# Patient Record
Sex: Female | Born: 1940 | Race: White | Hispanic: No | Marital: Married | Smoking: Never smoker
Health system: Southern US, Community
[De-identification: ages and names within clinical notes are randomized; demographics above are authoritative.]

## PROBLEM LIST (undated history)

## (undated) DIAGNOSIS — E785 Hyperlipidemia, unspecified: Secondary | ICD-10-CM

## (undated) DIAGNOSIS — R413 Other amnesia: Secondary | ICD-10-CM

## (undated) DIAGNOSIS — N189 Chronic kidney disease, unspecified: Secondary | ICD-10-CM

## (undated) DIAGNOSIS — F0390 Unspecified dementia without behavioral disturbance: Secondary | ICD-10-CM

## (undated) HISTORY — DX: Other amnesia: R41.3

## (undated) HISTORY — DX: Hyperlipidemia, unspecified: E78.5

---

## 1997-05-27 ENCOUNTER — Ambulatory Visit (HOSPITAL_COMMUNITY): Admission: RE | Admit: 1997-05-27 | Discharge: 1997-05-27 | Payer: Self-pay | Admitting: Gynecology

## 1998-06-29 ENCOUNTER — Ambulatory Visit (HOSPITAL_COMMUNITY): Admission: RE | Admit: 1998-06-29 | Discharge: 1998-06-29 | Payer: Self-pay | Admitting: Gynecology

## 1998-09-15 ENCOUNTER — Other Ambulatory Visit: Admission: RE | Admit: 1998-09-15 | Discharge: 1998-09-15 | Payer: Self-pay | Admitting: Gynecology

## 1999-07-04 ENCOUNTER — Ambulatory Visit (HOSPITAL_COMMUNITY): Admission: RE | Admit: 1999-07-04 | Discharge: 1999-07-04 | Payer: Self-pay | Admitting: Gynecology

## 1999-07-04 ENCOUNTER — Encounter: Payer: Self-pay | Admitting: Gynecology

## 1999-10-26 ENCOUNTER — Other Ambulatory Visit: Admission: RE | Admit: 1999-10-26 | Discharge: 1999-10-26 | Payer: Self-pay | Admitting: Gynecology

## 2000-05-14 ENCOUNTER — Encounter: Payer: Self-pay | Admitting: Orthopedic Surgery

## 2000-05-14 ENCOUNTER — Encounter: Admission: RE | Admit: 2000-05-14 | Discharge: 2000-05-14 | Payer: Self-pay | Admitting: Orthopedic Surgery

## 2000-07-04 ENCOUNTER — Ambulatory Visit (HOSPITAL_COMMUNITY): Admission: RE | Admit: 2000-07-04 | Discharge: 2000-07-04 | Payer: Self-pay | Admitting: Gynecology

## 2000-07-04 ENCOUNTER — Encounter: Payer: Self-pay | Admitting: Gynecology

## 2000-11-23 ENCOUNTER — Encounter: Admission: RE | Admit: 2000-11-23 | Discharge: 2000-11-23 | Payer: Self-pay

## 2000-12-04 ENCOUNTER — Other Ambulatory Visit: Admission: RE | Admit: 2000-12-04 | Discharge: 2000-12-04 | Payer: Self-pay | Admitting: Gynecology

## 2001-07-10 ENCOUNTER — Ambulatory Visit (HOSPITAL_COMMUNITY): Admission: RE | Admit: 2001-07-10 | Discharge: 2001-07-10 | Payer: Self-pay | Admitting: Gynecology

## 2001-07-10 ENCOUNTER — Encounter: Payer: Self-pay | Admitting: Gynecology

## 2001-12-12 ENCOUNTER — Other Ambulatory Visit: Admission: RE | Admit: 2001-12-12 | Discharge: 2001-12-12 | Payer: Self-pay | Admitting: Gynecology

## 2002-07-18 ENCOUNTER — Encounter: Payer: Self-pay | Admitting: Gynecology

## 2002-07-18 ENCOUNTER — Ambulatory Visit (HOSPITAL_COMMUNITY): Admission: RE | Admit: 2002-07-18 | Discharge: 2002-07-18 | Payer: Self-pay | Admitting: Gynecology

## 2002-10-11 ENCOUNTER — Emergency Department (HOSPITAL_COMMUNITY): Admission: EM | Admit: 2002-10-11 | Discharge: 2002-10-11 | Payer: Self-pay | Admitting: Emergency Medicine

## 2002-10-11 ENCOUNTER — Encounter: Payer: Self-pay | Admitting: Emergency Medicine

## 2002-12-08 ENCOUNTER — Other Ambulatory Visit: Admission: RE | Admit: 2002-12-08 | Discharge: 2002-12-08 | Payer: Self-pay | Admitting: Gynecology

## 2003-07-20 ENCOUNTER — Ambulatory Visit (HOSPITAL_COMMUNITY): Admission: RE | Admit: 2003-07-20 | Discharge: 2003-07-20 | Payer: Self-pay | Admitting: Obstetrics and Gynecology

## 2003-09-16 ENCOUNTER — Inpatient Hospital Stay (HOSPITAL_COMMUNITY): Admission: RE | Admit: 2003-09-16 | Discharge: 2003-09-18 | Payer: Self-pay | Admitting: Obstetrics and Gynecology

## 2003-09-16 ENCOUNTER — Encounter (INDEPENDENT_AMBULATORY_CARE_PROVIDER_SITE_OTHER): Payer: Self-pay | Admitting: Specialist

## 2004-01-04 ENCOUNTER — Other Ambulatory Visit: Admission: RE | Admit: 2004-01-04 | Discharge: 2004-01-04 | Payer: Self-pay | Admitting: Gynecology

## 2004-07-21 ENCOUNTER — Ambulatory Visit (HOSPITAL_COMMUNITY): Admission: RE | Admit: 2004-07-21 | Discharge: 2004-07-21 | Payer: Self-pay | Admitting: Gynecology

## 2005-01-18 ENCOUNTER — Other Ambulatory Visit: Admission: RE | Admit: 2005-01-18 | Discharge: 2005-01-18 | Payer: Self-pay | Admitting: Gynecology

## 2005-07-24 ENCOUNTER — Ambulatory Visit (HOSPITAL_COMMUNITY): Admission: RE | Admit: 2005-07-24 | Discharge: 2005-07-24 | Payer: Self-pay | Admitting: Gynecology

## 2005-10-04 ENCOUNTER — Inpatient Hospital Stay (HOSPITAL_COMMUNITY): Admission: EM | Admit: 2005-10-04 | Discharge: 2005-10-05 | Payer: Self-pay | Admitting: Emergency Medicine

## 2006-01-31 ENCOUNTER — Other Ambulatory Visit: Admission: RE | Admit: 2006-01-31 | Discharge: 2006-01-31 | Payer: Self-pay | Admitting: Gynecology

## 2006-07-27 ENCOUNTER — Ambulatory Visit (HOSPITAL_COMMUNITY): Admission: RE | Admit: 2006-07-27 | Discharge: 2006-07-27 | Payer: Self-pay | Admitting: *Deleted

## 2007-01-31 ENCOUNTER — Other Ambulatory Visit: Admission: RE | Admit: 2007-01-31 | Discharge: 2007-01-31 | Payer: Self-pay | Admitting: Gynecology

## 2007-02-12 ENCOUNTER — Ambulatory Visit (HOSPITAL_BASED_OUTPATIENT_CLINIC_OR_DEPARTMENT_OTHER): Admission: RE | Admit: 2007-02-12 | Discharge: 2007-02-12 | Payer: Self-pay | Admitting: Orthopedic Surgery

## 2007-04-02 ENCOUNTER — Encounter: Admission: RE | Admit: 2007-04-02 | Discharge: 2007-04-02 | Payer: Self-pay | Admitting: Obstetrics and Gynecology

## 2007-07-30 ENCOUNTER — Ambulatory Visit (HOSPITAL_COMMUNITY): Admission: RE | Admit: 2007-07-30 | Discharge: 2007-07-30 | Payer: Self-pay | Admitting: Obstetrics and Gynecology

## 2008-07-30 ENCOUNTER — Ambulatory Visit (HOSPITAL_COMMUNITY): Admission: RE | Admit: 2008-07-30 | Discharge: 2008-07-30 | Payer: Self-pay | Admitting: Obstetrics and Gynecology

## 2008-08-18 ENCOUNTER — Encounter: Admission: RE | Admit: 2008-08-18 | Discharge: 2008-08-18 | Payer: Self-pay | Admitting: Obstetrics and Gynecology

## 2008-10-14 HISTORY — PX: CARDIAC CATHETERIZATION: SHX172

## 2008-11-02 ENCOUNTER — Inpatient Hospital Stay (HOSPITAL_COMMUNITY): Admission: AD | Admit: 2008-11-02 | Discharge: 2008-11-04 | Payer: Self-pay | Admitting: Cardiology

## 2009-07-16 ENCOUNTER — Encounter: Admission: RE | Admit: 2009-07-16 | Discharge: 2009-07-16 | Payer: Self-pay | Admitting: Orthopedic Surgery

## 2009-08-05 ENCOUNTER — Encounter: Admission: RE | Admit: 2009-08-05 | Discharge: 2009-08-05 | Payer: Self-pay | Admitting: Orthopedic Surgery

## 2009-08-10 ENCOUNTER — Ambulatory Visit (HOSPITAL_COMMUNITY): Admission: RE | Admit: 2009-08-10 | Discharge: 2009-08-10 | Payer: Self-pay | Admitting: Family Medicine

## 2010-03-06 ENCOUNTER — Encounter: Payer: Self-pay | Admitting: Family Medicine

## 2010-03-06 ENCOUNTER — Encounter: Payer: Self-pay | Admitting: Orthopedic Surgery

## 2010-04-07 HISTORY — PX: DOPPLER ECHOCARDIOGRAPHY: SHX263

## 2010-04-07 HISTORY — PX: NM MYOCAR SINGLE W/SPECT: HXRAD625

## 2010-04-25 ENCOUNTER — Other Ambulatory Visit: Payer: Self-pay | Admitting: Obstetrics and Gynecology

## 2010-04-25 DIAGNOSIS — M8580 Other specified disorders of bone density and structure, unspecified site: Secondary | ICD-10-CM

## 2010-04-27 ENCOUNTER — Ambulatory Visit
Admission: RE | Admit: 2010-04-27 | Discharge: 2010-04-27 | Disposition: A | Discharge status: Home / Self Care | Payer: Medicare Other | Source: Ambulatory Visit | Attending: Obstetrics and Gynecology | Admitting: Obstetrics and Gynecology

## 2010-04-27 DIAGNOSIS — M8580 Other specified disorders of bone density and structure, unspecified site: Secondary | ICD-10-CM

## 2010-05-04 ENCOUNTER — Other Ambulatory Visit: Payer: Self-pay | Admitting: Orthopedic Surgery

## 2010-05-04 DIAGNOSIS — M545 Low back pain, unspecified: Secondary | ICD-10-CM

## 2010-05-04 DIAGNOSIS — M479 Spondylosis, unspecified: Secondary | ICD-10-CM

## 2010-05-05 ENCOUNTER — Ambulatory Visit
Admission: RE | Admit: 2010-05-05 | Discharge: 2010-05-05 | Disposition: A | Discharge status: Home / Self Care | Payer: Medicare Other | Source: Ambulatory Visit | Attending: Orthopedic Surgery | Admitting: Orthopedic Surgery

## 2010-05-05 DIAGNOSIS — M545 Low back pain: Secondary | ICD-10-CM

## 2010-05-05 DIAGNOSIS — M479 Spondylosis, unspecified: Secondary | ICD-10-CM

## 2010-05-11 ENCOUNTER — Other Ambulatory Visit: Payer: Self-pay | Admitting: Obstetrics and Gynecology

## 2010-05-20 LAB — HEMOGLOBIN A1C: Hgb A1c MFr Bld: 5.9 % (ref 4.6–6.1)

## 2010-05-20 LAB — TROPONIN I
Troponin I: 0.01 ng/mL (ref 0.00–0.06)
Troponin I: 0.01 ng/mL (ref 0.00–0.06)

## 2010-05-20 LAB — URINE MICROSCOPIC-ADD ON

## 2010-05-20 LAB — CBC
HCT: 39.5 % (ref 36.0–46.0)
Hemoglobin: 14.7 g/dL (ref 12.0–15.0)
MCHC: 34.3 g/dL (ref 30.0–36.0)
MCHC: 34.3 g/dL (ref 30.0–36.0)
MCV: 98.9 fL (ref 78.0–100.0)
MCV: 99.4 fL (ref 78.0–100.0)
Platelets: 172 10*3/uL (ref 150–400)
Platelets: 207 10*3/uL (ref 150–400)
RBC: 3.97 MIL/uL (ref 3.87–5.11)
WBC: 6.1 10*3/uL (ref 4.0–10.5)
WBC: 6.9 10*3/uL (ref 4.0–10.5)

## 2010-05-20 LAB — URINALYSIS, ROUTINE W REFLEX MICROSCOPIC
Bilirubin Urine: NEGATIVE
Glucose, UA: NEGATIVE mg/dL
Glucose, UA: NEGATIVE mg/dL
Ketones, ur: NEGATIVE mg/dL
Ketones, ur: NEGATIVE mg/dL
Nitrite: NEGATIVE
Protein, ur: NEGATIVE mg/dL
Specific Gravity, Urine: 1.01 (ref 1.005–1.030)
pH: 6 (ref 5.0–8.0)

## 2010-05-20 LAB — BASIC METABOLIC PANEL
BUN: 10 mg/dL (ref 6–23)
BUN: 15 mg/dL (ref 6–23)
CO2: 32 mEq/L (ref 19–32)
Calcium: 8.5 mg/dL (ref 8.4–10.5)
Calcium: 9.5 mg/dL (ref 8.4–10.5)
Chloride: 105 mEq/L (ref 96–112)
Creatinine, Ser: 0.96 mg/dL (ref 0.4–1.2)
Creatinine, Ser: 1.17 mg/dL (ref 0.4–1.2)
GFR calc Af Amer: 55 mL/min — ABNORMAL LOW (ref >60)
GFR calc Af Amer: >60 mL/min (ref >60)
GFR calc non Af Amer: 57 mL/min — ABNORMAL LOW (ref >60)

## 2010-05-20 LAB — APTT
aPTT: 25 seconds (ref 24–37)
aPTT: 35 seconds (ref 24–37)

## 2010-05-20 LAB — CK TOTAL AND CKMB (NOT AT ARMC)
CK, MB: 1.1 ng/mL (ref 0.3–4.0)
CK, MB: 1.7 ng/mL (ref 0.3–4.0)
Relative Index: INVALID (ref 0.0–2.5)
Relative Index: INVALID (ref 0.0–2.5)
Total CK: 105 U/L (ref 7–177)

## 2010-05-20 LAB — COMPREHENSIVE METABOLIC PANEL
AST: 31 U/L (ref 0–37)
GFR calc Af Amer: 55 mL/min — ABNORMAL LOW (ref >60)
GFR calc non Af Amer: 45 mL/min — ABNORMAL LOW (ref >60)
Glucose, Bld: 122 mg/dL — ABNORMAL HIGH (ref 70–99)
Potassium: 3.7 mEq/L (ref 3.5–5.1)
Sodium: 143 mEq/L (ref 135–145)

## 2010-05-20 LAB — TSH: TSH: 2.171 u[IU]/mL (ref 0.350–4.500)

## 2010-05-20 LAB — PROTIME-INR
INR: 0.9 (ref 0.00–1.49)
Prothrombin Time: 12.4 seconds (ref 11.6–15.2)

## 2010-05-20 LAB — URINE CULTURE: Colony Count: >=100000

## 2010-05-20 LAB — LIPID PANEL
Triglycerides: 122 mg/dL (ref <150)
VLDL: 24 mg/dL (ref 0–40)

## 2010-05-20 LAB — BRAIN NATRIURETIC PEPTIDE: Pro B Natriuretic peptide (BNP): 67 pg/mL (ref 0.0–100.0)

## 2010-06-20 ENCOUNTER — Other Ambulatory Visit (HOSPITAL_COMMUNITY): Payer: Self-pay | Admitting: Obstetrics and Gynecology

## 2010-06-20 DIAGNOSIS — Z1231 Encounter for screening mammogram for malignant neoplasm of breast: Secondary | ICD-10-CM

## 2010-06-28 NOTE — Op Note (Signed)
NAME:  Hannah Soto, Hannah Soto                ACCOUNT NO.:  [id number]   MEDICAL RECORD NO.:  [id number]          PATIENT TYPE:  AMB   LOCATION:  DSC                          FACILITY:  [facility]   PHYSICIAN:  Robert A. Thurston Hole, M.D. DATE OF BIRTH:  20-Mar-1940   DATE OF PROCEDURE:  02/12/2007  DATE OF DISCHARGE:                               OPERATIVE REPORT   PREOPERATIVE DIAGNOSIS:  Left knee medial and lateral meniscal tears  with chondromalacia and synovitis.   POSTOPERATIVE DIAGNOSIS:  Left knee medial and lateral meniscal tears  with chondromalacia and synovitis.   PROCEDURE:  1. Left knee EUA followed by arthroscopic partial medial and lateral      meniscectomies.  2. Left knee chondroplasty with partial synovectomy.  3. Left knee lateral retinacular release.   SURGEON:  Elana Alm. Thurston Hole, M.D.   ANESTHESIA:  Local with MAC.   OPERATIVE TIME:  30 minutes.   COMPLICATIONS:  None.   INDICATIONS FOR PROCEDURE:  The patient is a 70 year old woman who has  had 4-6 months of increasing left knee pain with exam and MRI  documenting meniscal tearing with chondromalacia and synovitis.  She has  failed conservative care and is now to undergo arthroscopy.   DESCRIPTION OF PROCEDURE:  The patient is brought to the operating room  on February 12, 2007 after a knee block was placed in the holding area  by anesthesia.  She was placed on the operating table in the supine  position.  Her left knee was examined under anesthesia.  She had full  range of motion, her knee was stable, ligamentous exam with normal  patellar tracking.  The left leg was prepped using sterile DuraPrep and  draped using sterile technique. Originally through an anterolateral  portal, the arthroscope with a pump attached was placed into an  anteromedial portal and an arthroscopic probe was placed.  On initial  inspection of the medial compartment, the articular cartilage showed 75%  grade 3 chondromalacia which was debrided.   Medial meniscus tear,  posterior medial horn of which 50% was resected back to a stable rim.  The intercondylar notch inspected and anterior and posterior cruciate  ligaments were normal.  The lateral compartment inspected, 25% grade 3  chondromalacia lateral tibial plateau which was debrided, the lateral  femoral condyle was intact.  The lateral meniscus tear 20%  posterolateral corner was resected back to a stable rim.  The  patellofemoral joint showed 25% grade 4 and the rest grade 3  chondromalacia on the patella and femoral groove and this was debrided.  The patella tracked normally.  Significant synovitis in the medial and  lateral gutters were debrided.  It was felt a lateral and retinacular  release with significantly decompressed patellofemoral joint  significantly helped with her patellofemoral chondromalacia and thus  this was done with an ArthroCare wand. No excessive bleeding was  encountered and after this was done there was significant decompression  noted of the patellofemoral joint.  Otherwise the medial and lateral  gutters were free of pathology. After this was done, it was felt that  all  pathology had been satisfactorily addressed.  The instruments were  removed.  The portals were closed with 3-0 nylon suture and injected  with 0.25% Marcaine with epinephrine and 4 mg of morphine.  Sterile  dressings were applied and the patient awakened and taken to the  recovery room in stable condition.   FOLLOW-UP CARE:  The patient  will be followed as an outpatient on  Vicodin and Relafen.  She will be back in the office in a week for  sutures out and follow-up.      Robert A. Thurston Hole, M.D.  Electronically Signed     RAW/MEDQ  D:  02/12/2007  T:  02/12/2007  Job:  [id number]

## 2010-07-01 NOTE — H&P (Signed)
NAME:  NERISSA, CONSTANTIN                ACCOUNT NO.:  [id number]   MEDICAL RECORD NO.:  [id number]          PATIENT TYPE:  INP   LOCATION:  1829                         FACILITY:  [facility]   PHYSICIAN:  Corinna L. Lendell Caprice, MDDATE OF BIRTH:  23-Jul-1940   DATE OF ADMISSION:  10/04/2005  DATE OF DISCHARGE:                                HISTORY & PHYSICAL   CHIEF COMPLAINT:  Chest pain.   HISTORY OF PRESENT ILLNESS:  Ms. Edsall is a pleasant 70 year old white  female patient of Dr. Janey Greaser who saw Dr. Marny Lowenstein in the office, today, for  about 3 minutes worth of vice-like pain around her chest.  It was very  severe.  She also felt short of breath.  According to the ED physician, she  was diaphoretic and pale upon admission.  The pain resolved.  She had had 4  baby aspirins in the office; and was sent to the emergency room.  She  reports having had a stress test in 2000 which she thinks was normal and has  no history of coronary artery disease.  Her risk factors are hyperlipidemia  and family history of early heart disease.  She has no be chest pain  currently.  She reports having had some left-sided fleeting chest pain that  woke her up from sleep last week.  It was sharp and different in character,  than today.  She has had no nausea, no belching, no dizziness, no  palpitations.   PAST MEDICAL HISTORY:  1. Hyperlipidemia.  2. Osteoarthritis.   MEDICINES:  1. Aspirin 81 mg a day.  2. Multivitamin a day.  3. Glucosamine chondroitin daily.  4. Zocor, she believes, 40 mg a day.   SOCIAL HISTORY:  She does not smoke.  She is married.  She is a retired  Administrator, [employer].  She has been under a lot of stress as she has been  volunteering to spear head the 100th anniversary celebration of her church.  She is married and here with her husband.  She drinks a glass of wine  occasionally with dinner.   FAMILY HISTORY:  Her father and two uncles had MIs in their early 49s.  Her  mother is in her 106s  and has coronary artery disease.   PAST SURGICAL HISTORY:  She has had a ganglion cyst removed from her left  wrist.  She has had a rectocele and cystocele repair.  She has had surgery  for fractured elbow on the left.  She has had several D&Cs.   REVIEW OF SYSTEMS:  As above, otherwise negative.   PHYSICAL EXAMINATION:  VITAL SIGNS:  Her temperature is 97.3; blood pressure  114/67; pulse 55; respiratory rate 20.  Oxygen saturation 100% on room air.  GENERAL:  The patient is well-nourished, well-developed in no acute  distress.  HEENT: Normocephalic, atraumatic.  Pupils equal, round, reactive to light.  Extraocular movements are intact.  Moist mucous membranes.  NECK:  Supple.  No lymphadenopathy.  No carotid bruits.  No JVD, no  thyromegaly.  LUNGS:  Clear to auscultation bilaterally without wheezes, rhonchi, or  rales.  CARDIOVASCULAR:  Regular rate and rhythm without murmurs, gallops or rubs.  ABDOMEN:  Normal bowel sounds, soft, nontender, nondistended.  GENITOURINARY AND RECTAL:  Deferred.  EXTREMITIES:  No clubbing, cyanosis or edema.  No calf pain.  Denna Haggard' sign  negative.   LABS:  Two sets of point care enzymes negative so far.  BMET unremarkable.  D-dimer 0.26.  CBC unremarkable.  EKG shows sinus bradycardia with a rate of  58, unchanged from previous EKG.  Chest x-ray negative.   ASSESSMENT AND PLAN:  1. Chest pain:  The patient will be admitted.  I will rule out myocardial      infarction.  I have asked [facility] to consult for probable      stress test.  2. Hyperlipidemia.      Corinna L. Lendell Caprice, MD  Electronically Signed     CLS/MEDQ  D:  10/04/2005  T:  10/04/2005  Job:  [id number]   cc:   Al Decant. Janey Greaser, MD

## 2010-07-01 NOTE — Consult Note (Signed)
NAME:  Hannah Soto, Hannah Soto NO.:  [id number]   MEDICAL RECORD NO.:  [id number]          PATIENT TYPE:  INP   LOCATION:  4705                         FACILITY:  [facility]   PHYSICIAN:  Lyn Records, M.D.   DATE OF BIRTH:  04/05/1940   DATE OF CONSULTATION:  10/04/2005  DATE OF DISCHARGE:                                   CONSULTATION   CARDIOLOGY CONSULTATION   CONCLUSIONS:  1. Chest discomfort described as a band-like pressure radiating around her      lower chest and around into her back lasting 3-5 minutes and resolving.      There is also a history of left parasternal chest discomfort that      awakened her from sleep one week ago that lasted approximately 30      minutes.  Recurrent but differing chest discomfort of uncertain      etiology. Rule out CAD.  2. History of hyperlipidemia.  3. Significant family history of coronary atherosclerosis (father,      grandfather, and brother all died of myocardial infarction at age 59).  4. History of osteoarthritis.   RECOMMENDATIONS:  1. Serial enzymes. Rule out myocardial infarction.  2. If markers are negative, proceed with stress Cardiolite in a.m. to rule      out evidence of ischemia.  3. Aspirin.  4. Lovenox.   COMMENT:  Patient is 70 years of age and has had two episodes of chest pain  within the past week. A week ago she was awakened with a left parasternal  pressure like sensation that lasted approximately 30 minutes and resolved.  There were no associated other symptoms, and there were no sequelae.   This morning after eating breakfast and sending an e-mail, she suddenly  developed a band-like discomfort around her lower chest and into the back.  The discomfort lasted less than five minutes. Did not radiate to the neck,  arms or into her legs. There was no dyspnea, tachy palpitation or other  complaints. She did become quite anxious. She is having no current  discomfort. The discomfort resolved  spontaneously. She was seen at the  __________  office and sent to the emergency room. In the emergency room she  was found to have a nonischemic EKG, and three sets of point of care markers  were negative for evidence of myocardial injury.   HABITS:  Does not smoke or drink.   SIGNIFICANT MEDICAL PROBLEMS:  Hyperlipidemia.   MEDICATIONS:  Simvastatin 40 mg per day, aspirin 81 mg per day.   PAST SURGICAL HISTORY:  Hysterectomy, D&C, cystocele and rectocele repair,  left elbow surgery, ganglion cyst removal left arm.   SOCIAL HISTORY:  Married for 47 years. Has two adult children.   PHYSICAL EXAMINATION:  GENERAL:  The patient is lying comfortably in bed.  She is in no distress.  VITAL SIGNS:  Blood pressure 120/70, heart rate 60. She is afebrile and 98  degrees Fahrenheit.  SKIN:  Clear. No nailbed cyanosis.  HEENT:  Unremarkable.  NECK:  No JVD or carotid bruits.  LUNGS:  Clear.  CARDIAC:  Unremarkable. No rub.  ABDOMEN:  Soft.  EXTREMITIES:  No edema.  NEUROLOGICAL:  Intact.   EKG is normal with exception of sinus bradycardia. Chest x-ray is  unremarkable.   Point of care markers are negative x3. Potassium 4.3, creatinine 1. D-dimer  0.26.      Lyn Records, M.D.  Electronically Signed     HWS/MEDQ  D:  10/04/2005  T:  10/05/2005  Job:  [id number]   cc:   Al Decant. Janey Greaser, MD

## 2010-07-01 NOTE — H&P (Signed)
NAME:  Hannah Soto, Hannah Soto                          ACCOUNT NO.:  [id number]   MEDICAL RECORD NO.:  [id number]                   PATIENT TYPE:  INP   LOCATION:  NA                                   [facility]   PHYSICIAN:  Randye Lobo, M.D.                DATE OF BIRTH:  01-29-1941   DATE OF ADMISSION:  09/16/2003  DATE OF DISCHARGE:                                HISTORY & PHYSICAL   CHIEF COMPLAINT:  1. Vaginal protrusion.  2. Urinary incontinence.   HISTORY OF PRESENT ILLNESS:  The patient is a 70 year old gravida 2, para 2,  Caucasian female, status post total abdominal hysterectomy with left  salpingo-oophorectomy in 1980, who presents with progressive and worsening  vaginal protrusion and urinary incontinence.  The patient had been seen and  followed by her primary gynecologist, Dr. Teodora Medici, who referred the  patient for evaluation and treatment of a diagnosed cystocele and rectocele.  The patient initially had trial of a pessary use, and this was  unsatisfactory.  The patient was experiencing some episodes of stress  incontinence with the pessary in place.  Multi-channel urodynamic testing  was performed with her pessary in the vagina, and no evidence of stress  incontinence was noted.  The CMG was stable.  The patient had a normal  uroflowmetry study, with a voided volume of 134 cc.  Her postvoid residual;  however, was 165 cc.  During the pressure flow study, with an infused volume  of 288 cc, the patient was not able to void.  The absence of the stress  incontinence, and the difficulty with urination, were attributed to relative  obstruction with the pessary in.   The patient desires surgical treatment for her prolapse and incontinence.   PAST OBSTETRIC AND GYNECOLOGIC HISTORIES:  1. Status post spontaneous vaginal delivery x2.  2. Status post total abdominal hysterectomy with left salpingo-oophorectomy     for uterine fibroids and ovarian cysts in 1980.  3.  Status post right oophorectomy in 1983.  4. A normal Pap smear in November of 2005.  5. The patient is a hormone therapy patient, current using Vivelle-Dot     0.0375 mg two times per week.   PAST MEDICAL HISTORY:  1. Hyperlipidemia.  This is controlled with Lipitor 40 mg p.o. daily.  2. History of heart palpitations secondary to stress.  The patient has had a     normal stress test.   PAST SURGICAL HISTORY:  1. Status post total abdominal hysterectomy with left salpingo-oophorectomy     in 1980.  2. Status post right salpingo-oophorectomy in 1983.  3. Status post fall in August of 2004.  A sprain of the left sacroiliac     joint.  The patient continues to have decreased sensation in the left     hip, along with swelling.  4. Crush injury to the left elbow.  The patient  is status post placement of     a titanium rod in the left arm.   MEDICATIONS:  1. Lipitor 40 mg p.o. daily.  2. Vivelle-Dot patch 0.0375 mg transdermally two times per week.  3. Estrace vaginal cream.   ALLERGIES:  CODEINE.   SOCIAL HISTORY:  The patient is married.  She is retired.  She denies the  use of tobacco or illicit drugs.  She reports 2 drinks of alcoholic  beverages per week.   FAMILY HISTORY:  Coronary artery disease - the patient's mother, father,  grandfather, and several aunts and uncles all have coronary artery disease.  The patient's father, grandfather, and uncles have all died of myocardial  infarction at age 34.  The family history is negative for colon, breast, or  ovarian cancer.   PHYSICAL EXAMINATION:  VITAL SIGNS:  Blood pressure 110/70, weight 148  pounds.  GENERAL:  The patient is a middle-aged Caucasian female in no acute  distress.  HEENT:  Normocephalic and atraumatic.  NECK:  Negative for adenopathy and thyromegaly.  LUNGS:  Clear to auscultation bilaterally.  HEART:  S1 and S2 with a regular rate and rhythm.  No evidence of a murmur,  rub, or gallop.  ABDOMEN:  A  well-healed Pfannenstiel incision without evidence of  herniation.  The abdomen is soft and nontender with without  hepatosplenomegaly or organomegaly.  PELVIC EXAM:  Normal external genitalia and urethra.  There is complete  vaginal vault eversion.  The cervix is noted to be absent.  On bimanual  examination, there is no evidence of any midline nor adnexal masses or  tenderness.   IMPRESSION:  The patient is a 70 year old, gravida 2, para 2 Caucasian  female with post-hysterectomy pelvic organ prolapse and stress incontinence  with a pessary use .   PLAN:  The patient will undergo a vaginal reconstruction with an anterior  and posterior colporrhaphy, possible enterocele repair, iliococcygeus  vaginal vault suspension, TVT mid urethral sling, and cystoscopy at the  [facility] of [place] on September 16, 2003.  Risks, benefits, and  alternatives have been discussed with the patient, who wishes to proceed.                                               Randye Lobo, M.D.    BES/MEDQ  D:  09/15/2003  T:  09/15/2003  Job:  [id number]

## 2010-07-01 NOTE — Discharge Summary (Signed)
NAME:  Hannah Soto, Hannah Soto                          ACCOUNT NO.:  [id number]   MEDICAL RECORD NO.:  [id number]                   PATIENT TYPE:  INP   LOCATION:  9312                                 FACILITY[facility]   PHYSICIAN:  Randye Lobo, M.D.                DATE OF BIRTH:  09/29/1940   DATE OF ADMISSION:  09/16/2003  DATE OF DISCHARGE:  09/18/2003                                 DISCHARGE SUMMARY   ADMISSION DIAGNOSES:  1. Complete vaginal vault eversion.  2. Continuing stress incontinence.   DISCHARGE DIAGNOSES:  1. Complete vaginal vault eversion.  2. Continuing stress incontinence.  3. Mild postoperative anemia.   PROCEDURES/OPERATIONS:  The patient underwent an anterior and posterior  colporrhaphy, TVT suburethral sling, cystoscopy, enterocele repair, and  iliococcygeus vaginal vault suspension procedure at the [facility] of  [place] on September 16, 2003, under the direction of Dr. Conley Simmonds with  the assistance of Dr. Teodora Medici.   HISTORY OF PRESENT ILLNESS:  The patient is a 70 year old, gravida 2, para 2  female, status post total abdominal hysterectomy several years prior.  She  presented to the office with progressive protrusion of the vagina.  The  patient was seen in the office and noted to have pelvic organ prolapse with  a cystocele, rectocele and a probable enterocele.  The patient did have a  trial of a pessary use and vaginal estrogen cream, and this was not  satisfactory to her.  With the pessary in place, the patient was developing  episodes of stress incontinence.  The patient wished for surgical evaluation  and treatment of the prolapse and incontinence, and the plan was made to  proceed with a pelvic floor reconstruction.   HOSPITAL COURSE:  The patient was admitted on September 16, 2003, at which time  she underwent an anterior and posterior colporrhaphy, TVT, suburethral  sling, cystoscopy, enterocele repair, and ileococcygeus vaginal vault  suspension under the direction of Dr. Wyvonnia Lora, and with the assistance  of Dr. Teodora Medici.  The surgery was uncomplicated.  Estimated blood loss  from the surgery was 500 mL.   Postoperatively, the patient had to deal with dizziness and weakness which  were attributed to her morphine PCA and to her Percocet.  The patient did  have stable vital signs and no evidence of tachycardia.  Her hemoglobin on  postoperative day #1 was noted to be 10.2.   On postoperative day #1, the patient was able to tolerate a regular diet.  She was able to ambulate.  She did wear PAS stockings and TED hose during  her hospitalization.   The patient did have a trial of voiding without her Foley catheter in place  and she reportedly emptied a few drops and had a large post void residual of  600 mL.  The catheter was therefore replaced and just left to gravity  drainage.  The patient was found to be in good condition and ready for discharge on  September 18, 2003.   DISCHARGE INSTRUCTIONS:  1. Discharge to home.  2. Decreased activity for 6 weeks.  The patient will not lift anything over     10 pounds for three months.  3. The patient will follow a regular diet.  4. The patient will leave the Foley catheter to drainage.  5. She will follow up in the office in one week for a bladder trial and her     catheter will be removed by herself at home prior to this visit.  6. The patient will call the office if she experiences any problems with     fever, nausea and vomiting, pain uncontrolled by her medication, heavy     vaginal bleeding, difficulty with her catheter, excessive dizziness or     any other concerns.   DISCHARGE MEDICATIONS:  1. The patient will take Percocet (5 mg/325 mg), 1/2 tablet p.o. q.4-6h.     p.r.n. pain.  2. Ibuprofen 600 mg p.o. q.6h. p.r.n. pain.  3. Iron sulfate 325 mg p.o. b.i.d.  4. Colace 100 mg p.o. every day p.r.n. constipation.                                                Randye Lobo, M.D.    BES/MEDQ  D:  09/18/2003  T:  09/20/2003  Job:  [id number]

## 2010-07-01 NOTE — Op Note (Signed)
NAME:  Hannah Soto, Hannah Soto                          ACCOUNT NO.:  [id number]   MEDICAL RECORD NO.:  [id number]                   PATIENT TYPE:  INP   LOCATION:  9312                                 FACILITY:  [facility]   PHYSICIAN:  Randye Lobo, M.D.                DATE OF BIRTH:  [birth year]   DATE OF PROCEDURE:  09/16/2003  DATE OF DISCHARGE:                                 OPERATIVE REPORT   PREOPERATIVE DIAGNOSES:  1. Pelvic organ prolapse.  2. Genuine stress incontinence.   POSTOPERATIVE DIAGNOSES:  1. Pelvic organ prolapse.  2. Genuine stress incontinence.   OPERATION PERFORMED:  Anterior and posterior colporrhaphy, TVT suburethral  sling, cystoscopy, enterocele repair, iliococcygeus vaginal vault  suspension.   SURGEON:  Randye Lobo, M.D.   ASSISTANT:  Leatha Gilding. Mezer, M.D.   ANESTHESIA:  General endotracheal.   IV FLUIDS:  2700 mL Ringers lactate.   ESTIMATED BLOOD LOSS:  500 mL.   URINE OUTPUT:  700 mL.   COMPLICATIONS:  None.   INDICATIONS FOR PROCEDURE:  The patient is a 70 year old gravida 2, para 2,  Caucasian female, status post total abdominal hysterectomy with left  salpingo-oophorectomy in 1980, who presented with progressive onset of  vaginal protrusion in addition to urinary incontinence.  The patient had a  trial of a pessary use which was unsatisfactory to her.  The patient also  was experiencing episodes of urinary stress incontinence with the pessary in  place.  On physical examination, the patient was noted to have complete  vaginal vault eversion and potentially, a large enterocele.  The patient  wished for surgical evaluation and treatment of the prolapse and  incontinence and a plan was made to proceed with an anterior and posterior  colporrhaphy, TVT, suburethral sling, cystoscopy, possible enterocele  repair, and iliococcygeal vaginal vault suspension after risks, benefits and  alternatives were discussed with her.   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  The cervix and the uterus were noted to  be absent.   Cystoscopy after placement of the TVT needle and then placement of the TVT  sling itself, there was no evidence of a foreign body in the bladder or  urethra, and the ureters were noted to be patent bilaterally.  The bladder  was visualized throughout 360 degrees and had a normal trigone and bladder  tone.   SPECIMENS:  Enterocele sac was sent to pathology.   DESCRIPTION OF PROCEDURE:  The patient was reidentified in the preoperative  hold area.  She did receive Ancef 1 g intravenously for antibiotic  prophylaxis.  She received both TED hose and PAS stockings for DVT  prophylaxis.  In the operating room the patient was placed in the supine  position and general endotracheal anesthesia was induced.  She was then  placed in the dorsal lithotomy position and the lower  abdomen, perineum, and  vagina were sterilely prepped and draped.  A Foley catheter was sterilely  placed inside the bladder.   An examination under anesthesia was performed.  The dimples of the vaginal  apices were grasped with Allis clamps and were marked with a suture of 0  Vicryl bilaterally for later identification of placement of the  iliococcygeus sutures.  The procedure began by marking the midline of the  vagina from the level of the posterior fourchette all the way up to  approximately 1.5 cm underneath the urethral meatus.  The vaginal tissue was  injected with 0.5% lidocaine with 1:200,000 epinephrine.  A transverse  incision was created at the top of the perineal body and the vagina was then  incised vertically in the midline using the Mayo scissors.  Using sharp  dissection, the perirectal fascia was dissected from the surrounding vaginal  epithelium bilaterally.  The enterocele sac was also similarly dissected  sharply from the vaginal mucosa bilaterally.  This dissection  did continue  anteriorly to dissect the subvaginal tissue and endopelvic fascia from the  overlying vaginal mucosa near the bladder.  Small bleeding vessels were  cauterized during this dissection using monopolar cautery.  At this time the  enterocele sac was identified by manipulating the Foley catheter to fill the  Foley balloon and by performing a rectal examination.  It was clear that  there was a large enterocele sac present.  Two hemostat clamps were then  used to grasp the enterocele sac which was sharply incised and opened.  The  enterocele sac was then dissected from the surrounding rectum posteriorly  and the bladder anteriorly.  The enterocele sac was then opened anteriorly.  At this time the enterocele was examined further and no bowel was noted to  be in the enterocele sac.  The enterocele was then reduced by placing a  pursestring suture of 2-0 Ethibond for excellent closure.  Excess enterocele  sac tissue was then excised and sent to pathology.  The dissection  anteriorly overlying the bladder then continued at this point such that the  dissection was carried back to the pubic rami bilaterally for placement of  the sling.   The suprapubic incision sites were then marked approximately 2 cm to the  right and to the left of the midline, and incisions were created sharply.  The TVT needles were then placed from a top down manner.  The TVT needle was  placed through the right suprapubic incision and was brought out through the  endopelvic fascia laterally to the urethra.  The same procedure that was  performed on the right hand side was then repeated on the left hand side  with the TVT needle on that side.  The Foley catheter was removed and  cystoscope was performed and there was no evidence of a foreign body in the  bladder or urethra.  The bladder was drained at this time and the TVT itself was attached to the needle passers and it was then brought up to the  suprapubic  incision.  Again cystoscopy was performed one final time and the  findings are as noted above.  Final tension was adjusted on the TVT at this  time and the plastic sheaths were removed.  The location of the TVT actually  became more urethrovesical junction because of the patient's anatomy.  There  was some bleeding noted along the exit site of the TVT on the patient's left  hand side in the  region of the endopelvic fascia vaginally.  Figure-of-eight  sutures of 2-0 Vicryl were placed in this region for hemostasis.   Anterior colporrhaphy sutures were next placed by placing vertical mattress  sutures of 0 Vicryl for excellent reduction of the cystocele.  Excess  vaginal mucosa was then trimmed away anteriorly and the anterior vaginal  wall was closed with a running locked suture of 2-0 Vicryl.  This was taken  down to the vaginal apices.  Vertical mattress sutures were then placed over  the enterocele sac reducing that.  The iliococcygeus sutures were next  placed by bringing the suture of 0 PDS through the vaginal mucosa at the  apex on the patient's left hand side, through the ileococcygeus musculature  on the ipsilateral side and then back out through the vagina on that  ipsilateral side again at the apex.  The same procedure that was performed  on the patient's left hand side was repeated on the right hand side for that  iliococcygeus suture.  There was excellent strength noted to the placement  of the sutures.  The remainder of the rectocele was then reduced by placing  vertical mattress sutures of 0 Vicryl.  There was some bleeding noted along  the limit of the dissection between the vagina and the perirectal fascia on  the patient's left hand side and this was treated with monopolar cautery and  then with figure-of-eight sutures of 2-0 Vicryl.  Excess vaginal mucosa was  then trimmed away posteriorly and from the vaginal apex itself and the  remainder of the vagina was then closed  with a running locked suture of 2-0  Vicryl.  At three points along the way of the posterior vaginal wall,  mattress sutures of 2-0 Vicryl were used to anchor the vaginal tissue to the  underlying perirectal fascia in the midline.  This provided a nice  continuous repair.  The iliococcygeus sutures were tied and the posterior  vagina was closed with a running locked suture of 2-0 Vicryl.  This  continued along the perineum as for suturing for an episiotomy repair.  The  Foley catheter was left to drainage.  An iodoform gauze packing was placed  inside the vagina.  The suprapubic incisions were closed with Dermabond.  The final rectal exam confirmed the absence of sutures in the rectum.  The  concluded the patient's procedure.  There were no complications  All sponge,  needle and instrument counts were correct.  The patient was then transferred  to the recovery room in stable and extubated condition.                                              Randye Lobo, M.D.    BES/MEDQ  D:  09/16/2003  T:  09/16/2003  Job:  [id number]

## 2010-07-01 NOTE — Discharge Summary (Signed)
NAME:  Hannah Soto, Hannah Soto                ACCOUNT NO.:  [id number]   MEDICAL RECORD NO.:  [id number]          PATIENT TYPE:  INP   LOCATION:  4705                         FACILITY:  [facility]   PHYSICIAN:  Corinna L. Lendell Caprice, MDDATE OF BIRTH:  03-23-1940   DATE OF ADMISSION:  10/04/2005  DATE OF DISCHARGE:  10/05/2005                                 DISCHARGE SUMMARY   DISCHARGE DIAGNOSES:  1. Chest pain.  2. Hyperlipidemia.  3. Osteoarthritis.   DISCHARGE MEDICATIONS:  1. Tylenol or ibuprofen as needed for pain.  2. Continue aspirin a day.  3. Multivitamins a day.  4. Glucosamine.  5. Zocor.   Follow up with Dr. Janey Greaser as needed.  Diet should be low cholesterol.  Activity ad lib.   CONSULTATIONS:  Dr. Verdis Prime.   PROCEDURES:  None.   PERTINENT LABORATORY DATA:  Serial cardiac enzymes negative.  CBC  unremarkable. PT/PTT normal.  BMET unremarkable.   SPECIAL STUDIES AND RADIOLOGY:  Preliminary Myoview shows normal ejection  fraction, no ischemia.  No wall motion abnormalities.  Chest x-ray nothing  acute.  EKG showed sinus bradycardia with a rate of 58.   HISTORY AND HOSPITAL COURSE:  Hannah Soto is a pleasant 70 year old white  female patient of Dr. Janey Greaser who reports severe band-like pain in the lower  chest upper abdomen.  She was seen in the office and sent to the emergency  room.  It lasted for a few minutes. She also reported some left-sided sharp  chest pain the week prior which awoke her from sleep.  She has a history of  hyperlipidemia and family history of early coronary artery disease.  She was  admitted to telemetry where she ruled out for myocardial infarction and  remained in normal sinus rhythm.  She had an unremarkable exam. Please see  H&P for complete admission details.  Cardiology was consulted for a stress  test and the preliminary report is that it is negative.  I spoke to the  [facility] cardiology PA. I suspect that  this may be musculoskeletal in nature and  possibly an anxiety component.  The patient reports that she has been under a lot of stress recently.  She  has had no further symptoms since she has been here. Her D-dimer was normal  and suspicion for PE was very low.      Corinna L. Lendell Caprice, MD  Electronically Signed     CLS/MEDQ  D:  10/05/2005  T:  10/06/2005  Job:  [id number]   cc:   Al Decant. Janey Greaser, MD

## 2010-08-30 ENCOUNTER — Ambulatory Visit (HOSPITAL_COMMUNITY)
Admission: RE | Admit: 2010-08-30 | Discharge: 2010-08-30 | Disposition: A | Discharge status: Home / Self Care | Payer: Medicare Other | Source: Ambulatory Visit | Attending: Obstetrics and Gynecology | Admitting: Obstetrics and Gynecology

## 2010-08-30 DIAGNOSIS — Z1231 Encounter for screening mammogram for malignant neoplasm of breast: Secondary | ICD-10-CM | POA: Insufficient documentation

## 2010-09-22 ENCOUNTER — Other Ambulatory Visit: Payer: Self-pay | Admitting: Orthopedic Surgery

## 2010-09-22 DIAGNOSIS — M545 Low back pain: Secondary | ICD-10-CM

## 2010-09-23 ENCOUNTER — Other Ambulatory Visit: Payer: Self-pay | Admitting: Orthopedic Surgery

## 2010-09-23 ENCOUNTER — Ambulatory Visit
Admission: RE | Admit: 2010-09-23 | Discharge: 2010-09-23 | Disposition: A | Discharge status: Home / Self Care | Payer: Medicare Other | Source: Ambulatory Visit | Attending: Orthopedic Surgery | Admitting: Orthopedic Surgery

## 2010-09-23 DIAGNOSIS — M545 Low back pain, unspecified: Secondary | ICD-10-CM

## 2010-09-23 MED ORDER — METHYLPREDNISOLONE ACETATE 40 MG/ML INJ SUSP (RADIOLOG
120.0000 mg | Freq: Once | INTRAMUSCULAR | Status: AC
  Administered 2010-09-23: 120 mg via EPIDURAL

## 2010-09-23 MED ORDER — IOHEXOL 180 MG/ML  SOLN
1.0000 mL | Freq: Once | INTRAMUSCULAR | Status: AC | PRN
  Administered 2010-09-23: 1 mL via EPIDURAL

## 2010-10-14 ENCOUNTER — Other Ambulatory Visit: Payer: Self-pay | Admitting: Orthopedic Surgery

## 2010-10-14 DIAGNOSIS — M549 Dorsalgia, unspecified: Secondary | ICD-10-CM

## 2010-10-18 ENCOUNTER — Ambulatory Visit
Admission: RE | Admit: 2010-10-18 | Discharge: 2010-10-18 | Disposition: A | Discharge status: Home / Self Care | Payer: Medicare Other | Source: Ambulatory Visit | Attending: Orthopedic Surgery | Admitting: Orthopedic Surgery

## 2010-10-18 DIAGNOSIS — M549 Dorsalgia, unspecified: Secondary | ICD-10-CM

## 2010-10-18 MED ORDER — METHYLPREDNISOLONE ACETATE 40 MG/ML INJ SUSP (RADIOLOG
120.0000 mg | Freq: Once | INTRAMUSCULAR | Status: AC
  Administered 2010-10-18: 120 mg via EPIDURAL

## 2010-10-18 MED ORDER — IOHEXOL 180 MG/ML  SOLN
1.0000 mL | Freq: Once | INTRAMUSCULAR | Status: AC | PRN
  Administered 2010-10-18: 1 mL via EPIDURAL

## 2011-08-31 ENCOUNTER — Other Ambulatory Visit (HOSPITAL_COMMUNITY): Payer: Self-pay | Admitting: Family Medicine

## 2011-08-31 DIAGNOSIS — Z1231 Encounter for screening mammogram for malignant neoplasm of breast: Secondary | ICD-10-CM

## 2011-09-20 ENCOUNTER — Ambulatory Visit (HOSPITAL_COMMUNITY)
Admission: RE | Admit: 2011-09-20 | Discharge: 2011-09-20 | Disposition: A | Discharge status: Home / Self Care | Payer: Medicare Other | Source: Ambulatory Visit | Attending: Family Medicine | Admitting: Family Medicine

## 2011-09-20 DIAGNOSIS — Z1231 Encounter for screening mammogram for malignant neoplasm of breast: Secondary | ICD-10-CM | POA: Insufficient documentation

## 2012-05-24 ENCOUNTER — Other Ambulatory Visit: Payer: Self-pay | Admitting: Family Medicine

## 2012-05-24 DIAGNOSIS — R413 Other amnesia: Secondary | ICD-10-CM

## 2012-06-04 ENCOUNTER — Other Ambulatory Visit: Payer: Medicare Other

## 2012-06-04 ENCOUNTER — Ambulatory Visit
Admission: RE | Admit: 2012-06-04 | Discharge: 2012-06-04 | Disposition: A | Discharge status: Home / Self Care | Payer: Medicare Other | Source: Ambulatory Visit | Attending: Family Medicine | Admitting: Family Medicine

## 2012-06-04 DIAGNOSIS — R413 Other amnesia: Secondary | ICD-10-CM

## 2012-06-11 ENCOUNTER — Encounter: Payer: Self-pay | Admitting: Neurology

## 2012-06-11 ENCOUNTER — Ambulatory Visit (INDEPENDENT_AMBULATORY_CARE_PROVIDER_SITE_OTHER): Payer: Medicare Other | Admitting: Neurology

## 2012-06-11 VITALS — BP 141/78 | HR 63 | Temp 98.2°F | Ht 63.0 in | Wt 146.0 lb

## 2012-06-11 DIAGNOSIS — R413 Other amnesia: Secondary | ICD-10-CM | POA: Insufficient documentation

## 2012-06-11 MED ORDER — DONEPEZIL HCL 5 MG PO TABS: 5.0000 mg | ORAL_TABLET | Freq: Every evening | ORAL | Status: DC | PRN

## 2012-06-11 NOTE — Progress Notes (Signed)
Subjective:    Patient ID: Hannah Soto is a 72 y.o. female.  HPI  Huston Foley, MD, PhD [facility] [address] [address] [place], [place] [address]  Dear Dr. Zachery Dauer,    I saw your patient, Hannah Soto, upon your kind request in my neurologic clinic today for initial consultation of her memory loss. The patient is accompanied by her husband today. As you know, Ms. Silfies is a very pleasant 72 year old right-handed lady with an underlying medical history of hyperlipidemia, osteopenia, arthritis, cataracts, nephro lithiasis, status post right wrist surgery pelvic prolapse surgery, who has been concerned about her memory for the past several months, perhaps more than one year. She recently saw you on 05/23/2012 and reported difficulty remembering things and misplacing things. Today she reports progressive memory loss and difficulty focusing and completing tasks. In your office her MMSE was 22/29 and she had trouble with clock drawing. Blood work in your office at the time showed a normal CBC, normal CMP, normal TSH and nonreactive RPR. Her current medications are Zocor, baby aspirin, Os-Cal 500, multivitamin, glucosamine and chondroitin, tramadol, Tylenol arthritis as needed. She had a plain head CT on 06/04/2012 and I reviewed the report with them. It showed age-appropriate changes and no acute findings. She reports problems with names, such as familiar and popular restaurants and meals they like to order. She has no recent illness. Never had a TIA or a Stroke, and denies one sided weakness, numbness, tingling, slurring of speech or droopy face and denies recurrent headaches. She sleeps well, no RBD, no mood d/o are reported. No hallucinations or paranoia are reported.  Her Past Medical History Is Significant For: Past Medical History  Diagnosis Date  . Memory loss     Her Past Surgical History Is Significant For: History reviewed. No pertinent past  surgical history.  Her Family History Is Significant For: Family History  Problem Relation Age of Onset  . Heart attack Father     Her Social History Is Significant For: History   Social History  . Marital Status: Married    Spouse Name: N/A    Number of Children: N/A  . Years of Education: N/A   Social History Main Topics  . Smoking status: Never Smoker   . Smokeless tobacco: None  . Alcohol Use: 2.0 oz/week    4 drink(s) per week  . Drug Use: No  . Sexually Active: None   Other Topics Concern  . None   Social History Narrative  . None    Her Allergies Are:  Allergies  Allergen Reactions  . Ciprofloxacin Other (See Comments)    Can't remember.  . Codeine     Can't remember   . Levofloxacin     Can't remember  . Sulfa Antibiotics   :   Her Current Medications Are:  Outpatient Encounter Prescriptions as of 06/11/2012  Medication Sig Dispense Refill  . acetaminophen (TYLENOL) 100 MG/ML solution Take 10 mg/kg by mouth every 4 (four) hours as needed for fever.      Marland Kitchen glucosamine-chondroitin 500-400 MG tablet Take 1 tablet by mouth 4 (four) times daily.      Marland Kitchen ibuprofen (ADVIL,MOTRIN) 100 MG chewable tablet Chew 100 mg by mouth every 8 (eight) hours as needed for fever.      . Multiple Vitamins-Minerals (CENTRUM SILVER ULTRA WOMENS) TABS Take 1 tablet by mouth daily.      . simvastatin (ZOCOR) 40 MG tablet  No facility-administered encounter medications on file as of 06/11/2012.  :   Review of Systems  Neurological: Positive for headaches.       Memory loss    Objective:  Neurologic Exam  Physical Exam Physical Examination:   Filed Vitals:   06/11/12 1018  BP: 141/78  Pulse: 63  Temp: 98.2 F (36.8 C)    General Examination: The patient is a very pleasant 72 y.o. female in no acute distress. She is calm and cooperative with the exam. She denies Auditory Hallucinations and Visual Hallucinations.   HEENT: Normocephalic, atraumatic, pupils  are equal, round and reactive to light and accommodation. Funduscopic exam is normal with sharp disc margins noted. Extraocular tracking shows mild saccadic breakdown without nystagmus noted. Hearing is intact. Tympanic membranes are clear bilaterally. Face is symmetric with no facial masking and normal facial sensation. There is no lip, neck or jaw tremor. Neck is not rigid with intact passive ROM. There are no carotid bruits on auscultation. Oropharynx exam reveals mild mouth dryness. No significant airway crowding is noted. Mallampati is class II. Tongue protrudes centrally and palate elevates symmetrically.    Chest: is clear to auscultation without wheezing, rhonchi or crackles noted.  Heart: sounds are regular and normal without murmurs, rubs or gallops noted.   Abdomen: is soft, non-tender and non-distended with normal bowel sounds appreciated on auscultation.  Extremities: There is no pitting edema in the distal lower extremities bilaterally. Pedal pulses are intact.  Skin: is warm and dry with no trophic changes noted.  Musculoskeletal: exam reveals no obvious joint deformities, tenderness or joint swelling or erythema.  Neurologically:  Mental status: The patient is awake and alert, paying good  attention. She is able to to partially provide the history. Her husband provides details. She is oriented to: person, situation and day of week. Her memory, attention, language and knowledge are impaired. There is no aphasia, agnosia, apraxia or anomia. There is a mild degree of bradyphrenia. Speech is mildly hypophonic with no dysarthria noted. Mood is congruent and affect is normal.  Her MMSE score is 20/30. CDT is 3/4. AFT (Animal Fluency Test) score is 5.  Fall Assessment Tool Score is 2.   Cranial nerves are as described above under HEENT exam. In addition, shoulder shrug is normal with equal shoulder height noted.  Motor exam: Normal bulk, and strength for age is noted. Tone is not rigid  with absence of cogwheeling in the bilateral extremities. There is overall no bradykinesia. There is no drift or rebound. There is no tremor. Romberg is negative. Reflexes are 2+ in the upper extremities and 1+ in the lower extremities. Toes are downgoing bilaterally. Fine motor skills: Finger taps, hand movements, and rapid alternating patting are not significantly impaired bilaterally. Foot taps and foot agility are not impaired bilaterally.   Cerebellar testing shows no dysmetria or intention tremor on finger to nose testing. Heel to shin is unremarkable. There is no truncal or gait ataxia.   Sensory exam is intact to light touch, pinprick, vibration, temperature sense and proprioception in the upper and lower extremities.   Gait, station and balance: She stands up from the seated position with no difficulty. No veering to one side is noted. No leaning to one side. Posture is age appropriate stooped. Stance is narrow-based. She turns en bloc. Tandem walk is normal. Balance is not significantly impaired.     Assessment and Plan:   Assessment and Plan:  In summary, LADONA ROSTEN is a very  pleasant 72 y.o.-year old female with a history of memory loss, concerning for Alzheimer's disease. Her MMSE is 20/30 today and her clock drawing is three out of four and animal naming is only 5. I also to get a feeling that she is not getting the best effort she could potentially give at this time. She seems to give up too soon. In the near future I would like to do a formal neurocognitive test. At this juncture, would like to get her started on Aricept 5 mg and I talked to her about this medication and explained to her and her husband the potential side effects with this medication. She is agreeable to starting this. Furthermore I would like to proceed with a brain MRI and brain MRA for further evaluation. I will see her back in 3 months from now, sooner if the need arises. I talked to her about maintaining a  healthy lifestyle in general. Again, at our next visit I would like to discuss the referral to neuropsychology. I am encouraged them to call with any interim questions, concerns, problems or updates and refill requests and test results.   Thank you very much for allowing me to participate in the care of this nice patient. If I can be of any further assistance to you please do not hesitate to call me at [phone].  Sincerely,   Huston Foley, MD, PhD

## 2012-06-11 NOTE — Patient Instructions (Addendum)
I do want to suggest a few things today:  We are going to do a MRI and MRA of your brain.   Remember to drink plenty of fluid, eat healthy meals and do not skip any meals. Try to eat protein with a every meal and eat a healthy snack such as fruit or nuts in between meals. Try to keep a regular sleep-wake schedule and try to exercise daily, particularly in the form of walking, 20-30 minutes a day, if you can.   Engage in social activities in your community and with your family and try to keep up with current events by reading the newspaper or watching the news.   As far as your medications are concerned, I would like to suggest starting you on generic Aricept for your memory.   I would like to see you back in 3 months, sooner if we need to. Please call us with any interim questions, concerns, problems, updates or refill requests.  Please also call us for any test results so we can go over those with you on the phone. Brett Canales is my clinical assistant and will answer any of your questions and relay your messages to me and also relay most of my messages to you.  Our phone number is [phone]. We also have an after hours call service for urgent matters and there is a physician on-call for urgent questions. For any emergencies you know to call 911 or go to the nearest emergency room.

## 2012-06-13 ENCOUNTER — Other Ambulatory Visit: Payer: Self-pay | Admitting: Dermatology

## 2012-06-24 ENCOUNTER — Ambulatory Visit
Admission: RE | Admit: 2012-06-24 | Discharge: 2012-06-24 | Disposition: A | Discharge status: Home / Self Care | Payer: Medicare Other | Source: Ambulatory Visit | Attending: Neurology | Admitting: Neurology

## 2012-06-24 DIAGNOSIS — R413 Other amnesia: Secondary | ICD-10-CM

## 2012-06-27 NOTE — Progress Notes (Signed)
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 This may very well have something to do with her memory loss. I would like to proceed with more formal memory testing that we had discussed during our visit in the form of neurocognitive testing. This will require a referral to her psychologist . If they are okay with this I would like to proceed with a referral in that regard and see her back in followup as previously scheduled. Please let me know if they would like to proceed with memory testing and I will place an order for that. Thanks,  Huston Foley, MD, PhD    ______

## 2012-06-28 NOTE — Progress Notes (Signed)
Quick Note:  Spoke with patient and relayed the results of their MRI as well as Dr Teofilo Pod advise or recommendations. The patient was also reminded of any future appointments. The patient understood and had no questions. She wants to discuss the findings with her family and PCP before scheduling neurocognitive testing.   ______

## 2012-07-24 ENCOUNTER — Other Ambulatory Visit (HOSPITAL_COMMUNITY): Payer: Self-pay | Admitting: Obstetrics and Gynecology

## 2012-07-24 DIAGNOSIS — Z1231 Encounter for screening mammogram for malignant neoplasm of breast: Secondary | ICD-10-CM

## 2012-08-08 ENCOUNTER — Telehealth: Payer: Self-pay | Admitting: Neurology

## 2012-08-08 NOTE — Telephone Encounter (Signed)
Spoke to patient. She just wanted to verify upcoming appointment.

## 2012-09-16 ENCOUNTER — Ambulatory Visit (INDEPENDENT_AMBULATORY_CARE_PROVIDER_SITE_OTHER): Payer: Medicare Other | Admitting: Neurology

## 2012-09-16 ENCOUNTER — Encounter: Payer: Self-pay | Admitting: Neurology

## 2012-09-16 VITALS — BP 144/76 | HR 62 | Temp 97.1°F | Ht 63.0 in | Wt 146.0 lb

## 2012-09-16 DIAGNOSIS — R413 Other amnesia: Secondary | ICD-10-CM

## 2012-09-16 DIAGNOSIS — G309 Alzheimer's disease, unspecified: Secondary | ICD-10-CM

## 2012-09-16 DIAGNOSIS — F0280 Dementia in other diseases classified elsewhere without behavioral disturbance: Secondary | ICD-10-CM

## 2012-09-16 MED ORDER — DONEPEZIL HCL 10 MG PO TABS: 10.0000 mg | ORAL_TABLET | Freq: Every day | ORAL | Status: DC

## 2012-09-16 NOTE — Progress Notes (Signed)
Subjective:    Patient ID: Hannah Soto is a 72 y.o. female.  HPI  Interim history:   Hannah Soto is a very pleasant 72 year old right-handed lady with an underlying medical history of hyperlipidemia, osteopenia, arthritis, cataracts, nephrolithiasis, status post right wrist surgery and pelvic prolapse surgery, who presents for FU consultation of her memory loss. She is accompanied by her husband again today. I first met him on 06/11/2012, at which time she reported memory loss over the past several months. Her MMSE in her primary care physician's office on 05/23/2012 was 22/29. and she had trouble with clock drawing. Blood work with her PCP had shown a normal CBC, normal CMP, normal TSH and nonreactive RPR. At the time of her first visit with me I suggested a brain MRI and MRA, which had on 06/24/12: Moderate-severe mesial temporal and hippocampal atrophy and MRA showed: Right middle cerebral artery M2 branches have decreased signal on 3D reconstuctions. These branches appear normal on axial views. The M3 and distal branches appear normal. Therefore this likely represents artifact. 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 Remainder of medium to large sized vessels appear normal.  Her MMSE was 20/30 at the time and I suggested starting her on Aricept 5 mg. she has been taking Aricept each night but has been taking it more on an as-needed basis, indicating that she does take it on more days than not. Her husband indicates that he has not been overseeing her medications regularly. She has had no problems tolerating Aricept. She indicates no new changes to other medications or her medical history. She has no new complaints and feels she has been doing well.  Her Past Medical History Is Significant For: Past Medical History  Diagnosis Date  . Memory loss     Her Past Surgical History Is Significant For: History reviewed. No pertinent past  surgical history.  Her Family History Is Significant For: Family History  Problem Relation Age of Onset  . Heart attack Father     Her Social History Is Significant For: History   Social History  . Marital Status: Married    Spouse Name: N/A    Number of Children: N/A  . Years of Education: N/A   Social History Main Topics  . Smoking status: Never Smoker   . Smokeless tobacco: None  . Alcohol Use: 2.0 oz/week    4 drink(s) per week  . Drug Use: No  . Sexually Active: None   Other Topics Concern  . None   Social History Narrative  . None    Her Allergies Are:  Allergies  Allergen Reactions  . Ciprofloxacin Other (See Comments)    Can't remember.  . Codeine     Can't remember   . Levofloxacin     Can't remember  . Sulfa Antibiotics   :   Her Current Medications Are:  Outpatient Encounter Prescriptions as of 09/16/2012  Medication Sig Dispense Refill  . acetaminophen (TYLENOL) 100 MG/ML solution Take 10 mg/kg by mouth every 4 (four) hours as needed for fever.      . donepezil (ARICEPT) 10 MG tablet Take 1 tablet (10 mg total) by mouth at bedtime.  30 tablet  3  . glucosamine-chondroitin 500-400 MG tablet Take 1 tablet by mouth 4 (four) times daily.      Marland Kitchen ibuprofen (ADVIL,MOTRIN) 100 MG chewable tablet Chew 100 mg by mouth every 8 (eight) hours as needed for fever.      Marland Kitchen  Multiple Vitamins-Minerals (CENTRUM SILVER ULTRA WOMENS) TABS Take 1 tablet by mouth daily.      . simvastatin (ZOCOR) 40 MG tablet       . [DISCONTINUED] donepezil (ARICEPT) 5 MG tablet Take 1 tablet (5 mg total) by mouth at bedtime as needed.  30 tablet  3  . benzonatate (TESSALON) 100 MG capsule        No facility-administered encounter medications on file as of 09/16/2012.    Review of Systems  All other systems reviewed and are negative.    Objective:  Neurologic Exam  Physical Exam Physical Examination:   Filed Vitals:   09/16/12 1130  BP: 144/76  Pulse: 62  Temp: 97.1 F  (36.2 C)    General Examination: The patient is a very pleasant 72 y.o. female in no acute distress. She is calm and cooperative with the exam. She denies Auditory Hallucinations and Visual Hallucinations.   HEENT: Normocephalic, atraumatic, pupils are equal, round and reactive to light and accommodation. Extraocular tracking shows mild saccadic breakdown without nystagmus noted. Hearing is intact. Face is symmetric with no facial masking and normal facial sensation. There is no lip, neck or jaw tremor. Neck is not rigid with intact passive ROM. There are no carotid bruits on auscultation. Oropharynx exam reveals mild mouth dryness. No significant airway crowding is noted. Mallampati is class II. Tongue protrudes centrally and palate elevates symmetrically.    Chest: is clear to auscultation without wheezing, rhonchi or crackles noted.  Heart: sounds are regular and normal without murmurs, rubs or gallops noted.   Abdomen: is soft, non-tender and non-distended with normal bowel sounds appreciated on auscultation.  Extremities: There is no pitting edema in the distal lower extremities bilaterally. Pedal pulses are intact.  Skin: is warm and dry with no trophic changes noted.  Musculoskeletal: exam reveals no obvious joint deformities, tenderness or joint swelling or erythema.  Neurologically:  Mental status: The patient is awake and alert, paying good  attention. She is able to to partially provide the history. Her husband provides details. She is oriented to: person, situation and day of week. Her memory, attention, language and knowledge are impaired and she looks to her husband for some of the answers to my questions. There is no aphasia, agnosia, apraxia or anomia. There is a mild degree of bradyphrenia. Speech is mildly hypophonic with no dysarthria noted. Mood is congruent and affect is normal.  On 06/11/2012: Her MMSE score is 20/30. CDT is 3/4. AFT (Animal Fluency Test) score is 5. Fall  Assessment Tool Score is 2.   Cranial nerves are as described above under HEENT exam. In addition, shoulder shrug is normal with equal shoulder height noted.  Motor exam: Normal bulk, and strength for age is noted. Tone is not rigid with absence of cogwheeling in the bilateral extremities. There is overall no bradykinesia. There is no drift or rebound. There is no tremor. Romberg is negative. Reflexes are 2+ in the upper extremities and 1+ in the lower extremities. Fine motor skills: Finger taps, hand movements, and rapid alternating patting are not significantly impaired bilaterally. Foot taps and foot agility are not impaired bilaterally.   Cerebellar testing shows no dysmetria or intention tremor on finger to nose testing. There is no truncal or gait ataxia.   Sensory exam is intact to light touch, pinprick, vibration, temperature sense and proprioception in the upper and lower extremities.   Gait, station and balance: She stands up from the seated position with no difficulty.  No veering to one side is noted. No leaning to one side. Posture is age appropriate stooped. Stance is narrow-based. She turns en bloc. Tandem walk is normal. Balance is not significantly impaired.   Assessment and Plan:     In summary, SHANEL PRAZAK is a very pleasant 72 y.o.-year old female with a history of memory loss, concerning for Alzheimer's disease. Her MMSE recently was 20/30. I talked to her and her husband about recent MRI and MRA results as well. She does have significant atrophy. She has been able to tolerate Aricept 5 mg and I would like to increase it to 10 mg daily. I did explain to them that this is a daily not an as-needed basis. I provided her with a new prescription. I would like to proceed with formal neurocognitive testing at this juncture and she is agreeable. I will initiate a referral in that regard. I will see her back in 3 months from now, sooner if the need arises. I talked to her about  maintaining a healthy lifestyle in general. I encouraged them to call with any interim questions, concerns, problems or updates and refill requests and test results.

## 2012-09-16 NOTE — Patient Instructions (Addendum)
I think overall you are doing fairly well but I do want to suggest a few things today:  Remember to drink plenty of fluid, eat healthy meals and do not skip any meals. Try to eat protein with a every meal and eat a healthy snack such as fruit or nuts in between meals. Try to keep a regular sleep-wake schedule and try to exercise daily, particularly in the form of walking, 20-30 minutes a day, if you can.   Engage in social activities in your community and with your family and try to keep up with current events by reading the newspaper or watching the news.   As far as your medications are concerned, I would like to suggest increasing your Aricept to 10 mg each evening. You should take it regularly, this is NOT an as needed medication.    As far as diagnostic testing: referral to neuropsychological testing.   I would like to see you back in 3 months, sooner if we need to. Please call us with any interim questions, concerns, problems, updates or refill requests.  Please also call us for any test results so we can go over those with you on the phone. Brett Canales is my clinical assistant and will answer any of your questions and relay your messages to me and also relay most of my messages to you.  Our phone number is [phone]. We also have an after hours call service for urgent matters and there is a physician on-call for urgent questions. For any emergencies you know to call 911 or go to the nearest emergency room.

## 2012-09-18 ENCOUNTER — Other Ambulatory Visit: Payer: Self-pay

## 2012-09-24 ENCOUNTER — Ambulatory Visit (HOSPITAL_COMMUNITY)
Admission: RE | Admit: 2012-09-24 | Discharge: 2012-09-24 | Disposition: A | Discharge status: Home / Self Care | Payer: Medicare Other | Source: Ambulatory Visit | Attending: Obstetrics and Gynecology | Admitting: Obstetrics and Gynecology

## 2012-09-24 DIAGNOSIS — Z1231 Encounter for screening mammogram for malignant neoplasm of breast: Secondary | ICD-10-CM | POA: Insufficient documentation

## 2012-10-07 ENCOUNTER — Telehealth: Payer: Self-pay | Admitting: *Deleted

## 2012-10-07 NOTE — Telephone Encounter (Signed)
Returning pt's call regarding a HA that she has had all w/e.  Attempted to contact pt to relay Dr Teofilo Pod advice to go to urgent care: line busy.

## 2012-10-07 NOTE — Telephone Encounter (Signed)
4 severe or new onset headaches I would really recommend that she proceed to urgent care. Especially since she has had persistent headache I would recommend that she be seen at your or urgent care soon as possible. I was not aware of any headache history when I saw her for her memory problems.

## 2012-10-07 NOTE — Telephone Encounter (Signed)
Spoke with pt:  Advised pt to go to an urgent care center and/or to contact her PCP to make an appointment asap.  Pt understood and will visit an urgent care today.

## 2012-10-08 ENCOUNTER — Telehealth: Payer: Self-pay | Admitting: Neurology

## 2012-10-08 NOTE — Telephone Encounter (Signed)
Hannah Soto went to doctor yesterday and everything checked out good, she is feeling much better.

## 2012-10-15 ENCOUNTER — Telehealth: Payer: Self-pay | Admitting: Neurology

## 2012-10-16 NOTE — Telephone Encounter (Signed)
Husband states that his wife believes that we need information from them. I reviewed that patient was advised by [place] to go to Urgent care for severe headache/migraine and that Dr. Frances Furbish was not aware of a history of headaches or migraines. Husband states that patient does not have a history of headaches or migraines. He also states that patient has been getting headaches in the morning after taking medication. She does not get them daily and they generally go away. I asked husband to track these headaches. When do they begin and end? When do they start in association with taking medication. That is, if they are occurring after taking medication, how long after? How often are they occurring.   I have asked the patient's husband to track these for about two weeks and then call back and ask for me. I let him know they may not transfer call to me but they can mark the call for me. I will then note this call so we can have a record of these headaches for patient's next office visit in November.

## 2012-11-11 DIAGNOSIS — R413 Other amnesia: Secondary | ICD-10-CM

## 2012-12-02 ENCOUNTER — Other Ambulatory Visit: Payer: Self-pay | Admitting: Obstetrics and Gynecology

## 2012-12-02 DIAGNOSIS — M8580 Other specified disorders of bone density and structure, unspecified site: Secondary | ICD-10-CM

## 2012-12-06 ENCOUNTER — Other Ambulatory Visit: Payer: Medicare Other

## 2012-12-17 ENCOUNTER — Encounter: Payer: Self-pay | Admitting: Neurology

## 2012-12-17 ENCOUNTER — Ambulatory Visit (INDEPENDENT_AMBULATORY_CARE_PROVIDER_SITE_OTHER): Payer: Medicare Other | Admitting: Neurology

## 2012-12-17 VITALS — BP 123/74 | HR 61 | Temp 96.9°F | Ht 63.0 in | Wt 139.0 lb

## 2012-12-17 DIAGNOSIS — F0280 Dementia in other diseases classified elsewhere without behavioral disturbance: Secondary | ICD-10-CM

## 2012-12-17 NOTE — Patient Instructions (Addendum)
I think overall you are doing fairly well but I do want to suggest a few things today:  Remember to drink plenty of fluid, eat healthy meals and do not skip any meals. Try to eat protein with a every meal and eat a healthy snack such as fruit or nuts in between meals. Try to keep a regular sleep-wake schedule and try to exercise daily, particularly in the form of walking, 20-30 minutes a day, if you can.   Engage in social activities in your community and with your family and try to keep up with current events by reading the newspaper or watching the news. Try word puzzles, easy Sodoku.  As far as your medications are concerned, I would like to suggest no changes. Try to look into [email or web address]    As far as diagnostic testing: no new test at this time.   I would like to see you back in 6 months, sooner if we need to. Please call us with any interim questions, concerns, problems, updates or refill requests.  Brett Canales is my clinical assistant and will answer any of your questions and relay your messages to me and also relay most of my messages to you.  Our phone number is [phone]. We also have an after hours call service for urgent matters and there is a physician on-call for urgent questions. For any emergencies you know to call 911 or go to the nearest emergency room.

## 2012-12-17 NOTE — Progress Notes (Signed)
Subjective:    Patient ID: Hannah Soto is a 72 y.o. female.  HPI  Interim history:   Hannah Soto is a very pleasant 72 year old right-handed lady with an underlying medical history of hyperlipidemia, osteopenia, arthritis, cataracts, nephrolithiasis, status post right wrist surgery and pelvic prolapse surgery, who presents for FU consultation of her memory loss. She is accompanied by her husband again today. I last saw her on 09/16/2012, at which time I reinforced the importance of taking the Aricept daily. I also increase it from 5 to10 mg daily. We also talked about her recent brain scan results. In addition, I referred her for formal cognitive testing. She met with Dr. Leonides Cave on 11/11/2012 and I reviewed his neuropsychological report with them: Neurocognitive testing indicated global cognitive compromise, mostly to a severe degree, no indications of mood disturbance, difficulties with psychosocial adjustment or behavioral problems. Dr. 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 She needed line of sight supervision while cooking. He felt that her ability to safely drive was questionable. He felt that she did not necessarily had sufficient mental capacity to make complex decisions in her own interests and suggested that her husband seek power of attorney for her finances as well as her health (he already has health care POA). He also suggested that they involve their adult children more and her wife's health situation. I first met her on 06/11/2012, at which time she reported memory loss over the past several months. Her MMSE in her primary care physician's office on 05/23/2012 was 22/29. and she had trouble with clock drawing. Blood work with her PCP had shown a normal CBC, normal CMP, normal TSH and nonreactive RPR. At the time of her first visit with me I suggested a brain MRI and  MRA, which had on 06/24/12: Moderate-severe mesial temporal and hippocampal atrophy and MRA showed: Right middle cerebral artery M2 branches have decreased signal on 3D reconstuctions. These branches appear normal on axial views. The M3 and distal branches appear normal. Therefore this likely represents artifact. 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 Remainder of medium to large sized vessels appear normal.  Her MMSE was 20/30 on 06/11/12 and I suggested starting her on Aricept 5 mg daily, but she was taking it more on an as-needed basis, indicating that she does took it on more days than not. Her husband indicated that he had not been overseeing her medications regularly. She had no problems tolerating Aricept at the lower dose. She indicates no new changes to other medications or her medical history. She has no new complaints and feels she has been doing well, better than last time, and denies SEs, but has had some recent intermittent, mostly mild diarrhea.  Her Past Medical History Is Significant For: Past Medical History  Diagnosis Date  . Memory loss     Her Past Surgical History Is Significant For: History reviewed. No pertinent past surgical history.  Her Family History Is Significant For: Family History  Problem Relation Age of Onset  . Heart attack Father     Her Social History Is Significant For: History   Social History  . Marital Status: Married    Spouse Name: N/A    Number of Children: N/A  . Years of Education: N/A   Social History Main Topics  . Smoking status: Never Smoker   .  Smokeless tobacco: None  . Alcohol Use: 2.0 oz/week    4 drink(s) per week  . Drug Use: No  . Sexual Activity: None   Other Topics Concern  . None   Social History Narrative  . None    Her Allergies Are:  Allergies  Allergen Reactions  . Ciprofloxacin Other (See Comments)    Can't remember.  . Codeine     Can't  remember   . Levofloxacin     Can't remember  . Sulfa Antibiotics   :   Her Current Medications Are:  Outpatient Encounter Prescriptions as of 12/17/2012  Medication Sig  . acetaminophen (TYLENOL) 100 MG/ML solution Take 10 mg/kg by mouth every 4 (four) hours as needed for fever.  . benzonatate (TESSALON) 100 MG capsule   . BOOSTRIX 5-2.5-18.5 injection   . donepezil (ARICEPT) 10 MG tablet Take 1 tablet (10 mg total) by mouth at bedtime.  Marland Kitchen glucosamine-chondroitin 500-400 MG tablet Take 1 tablet by mouth 4 (four) times daily.  Marland Kitchen ibuprofen (ADVIL,MOTRIN) 100 MG chewable tablet Chew 100 mg by mouth every 8 (eight) hours as needed for fever.  . Multiple Vitamins-Minerals (CENTRUM SILVER ULTRA WOMENS) TABS Take 1 tablet by mouth daily.  . simvastatin (ZOCOR) 40 MG tablet    Review of Systems:  Out of a complete 14 point review of systems, all are reviewed and negative with the exception of these symptoms as listed below:   Review of Systems  Constitutional: Negative.   HENT: Negative.   Eyes: Negative.   Respiratory: Negative.   Cardiovascular: Negative.   Gastrointestinal: Positive for diarrhea.  Endocrine: Negative.   Genitourinary: Negative.   Musculoskeletal: Negative.   Skin: Negative.   Allergic/Immunologic: Negative.   Neurological: Negative.   Hematological: Negative.   Psychiatric/Behavioral: Negative.     Objective:  Neurologic Exam  Physical Exam Physical Examination:   Filed Vitals:   12/17/12 1236  BP: 123/74  Pulse: 61  Temp: 96.9 F (36.1 C)   General Examination: The patient is a very pleasant 72 y.o. female in no acute distress. She is calm and cooperative with the exam. She denies Auditory Hallucinations and Visual Hallucinations.   HEENT: Normocephalic, atraumatic, pupils are equal, round and reactive to light and accommodation. Extraocular tracking shows mild saccadic breakdown without nystagmus noted. Hearing is intact. Face is symmetric with no  facial masking and normal facial sensation. There is no lip, neck or jaw tremor. Neck is not rigid with intact passive ROM. There are no carotid bruits on auscultation. Oropharynx exam reveals mild mouth dryness. No significant airway crowding is noted. Mallampati is class II. Tongue protrudes centrally and palate elevates symmetrically.    Chest: is clear to auscultation without wheezing, rhonchi or crackles noted.  Heart: sounds are regular and normal without murmurs, rubs or gallops noted.   Abdomen: is soft, non-tender and non-distended with normal bowel sounds appreciated on auscultation.  Extremities: There is no pitting edema in the distal lower extremities bilaterally. Pedal pulses are intact.  Skin: is warm and dry with no trophic changes noted.  Musculoskeletal: exam reveals no obvious joint deformities, tenderness or joint swelling or erythema.  Neurologically:  Mental status: The patient is awake and alert, paying good  attention. She is able to to partially provide the history. Her husband provides details. She is oriented to: person, situation and day of week, city, county, state, not exact date. Her memory, attention, language and knowledge are impaired. There is no aphasia, agnosia, apraxia or  anomia. There is a mild degree of bradyphrenia. Speech is mildly hypophonic with no dysarthria noted. Mood is congruent and affect is normal.  On 06/11/2012: Her MMSE score was 20/30. CDT was 3/4. AFT (Animal Fluency Test) score was 5.  On 12/17/12: CDT was 2/4, AFT 7.   Cranial nerves are as described above under HEENT exam. In addition, shoulder shrug is normal with equal shoulder height noted.  Motor exam: Normal bulk, and strength for age is noted. Tone is not rigid with absence of cogwheeling in the bilateral extremities. There is overall no bradykinesia. There is no drift or rebound. There is no tremor. Romberg is negative. Reflexes are 2+ in the upper extremities and 1+ in the lower  extremities. Fine motor skills: Finger taps, hand movements, and rapid alternating patting are not significantly impaired bilaterally. Foot taps and foot agility are not impaired bilaterally.   Cerebellar testing shows no dysmetria or intention tremor on finger to nose testing. There is no truncal or gait ataxia.   Sensory exam is intact to light touch, pinprick, vibration, temperature sense in the upper and lower extremities.   Gait, station and balance: She stands up from the seated position with no difficulty. No veering to one side is noted. No leaning to one side. Posture is age appropriate stooped. Stance is narrow-based. She turns en bloc. Tandem walk is normal. Balance is not significantly impaired.   Assessment and Plan:   In summary, Hannah Soto is a very pleasant 72 year old female with a history of memory loss, concerning for Alzheimer's disease. I talked to her and her husband again about her recent test results including her cognitive testing. She has been able to tolerate Aricept 10 mg. She has been doing well at this time.and I reassured the patient in that regard.  I had a long chat with the patient and her husband about my findings and the diagnosis of memory loss, particularly dementia, its prognosis and treatment options. We talked about medical treatments and non-pharmacological approaches. We talked about maintaining a healthy lifestyle in general and staying active mentally and physically. I encouraged the patient to eat healthy, exercise daily and keep well hydrated, to keep a scheduled bedtime and wake time routine, to not skip any meals and eat healthy snacks in between meals and to have protein with every meal. I stressed the importance of regular exercise, within of course the patient's own mobility limitations. I encouraged the patient to keep up with current events by reading the news paper or watching the news and to do word puzzles, or if feasible, to go on [email or web address].    As far as further diagnostic testing is concerned, I suggested the following: no new test today. As far as medications are concerned, I recommended the following at this time: no change. I would like for him to observe her driving and comment about it. He was in agreement. He feels, she is currently okay to drive locally.  I answered all their questions today and they  were in agreement with the above outlined plan. I would like to see the patient back in 6 months, sooner if the need arises and encouraged them to call with any interim questions, concerns, problems, updates and refill requests.

## 2012-12-19 ENCOUNTER — Other Ambulatory Visit: Payer: Self-pay

## 2013-01-07 ENCOUNTER — Ambulatory Visit
Admission: RE | Admit: 2013-01-07 | Discharge: 2013-01-07 | Disposition: A | Discharge status: Home / Self Care | Payer: Medicare Other | Source: Ambulatory Visit | Attending: Obstetrics and Gynecology | Admitting: Obstetrics and Gynecology

## 2013-01-07 ENCOUNTER — Other Ambulatory Visit: Payer: Self-pay | Admitting: Neurology

## 2013-01-07 DIAGNOSIS — M8580 Other specified disorders of bone density and structure, unspecified site: Secondary | ICD-10-CM

## 2013-04-25 ENCOUNTER — Encounter: Payer: Self-pay | Admitting: *Deleted

## 2013-04-29 ENCOUNTER — Encounter: Payer: Self-pay | Admitting: Cardiology

## 2013-04-30 ENCOUNTER — Encounter: Payer: Self-pay | Admitting: Cardiology

## 2013-04-30 ENCOUNTER — Ambulatory Visit (INDEPENDENT_AMBULATORY_CARE_PROVIDER_SITE_OTHER): Payer: 59 | Admitting: Cardiology

## 2013-04-30 VITALS — BP 118/60 | HR 64 | Ht 63.0 in | Wt 132.5 lb

## 2013-04-30 DIAGNOSIS — E782 Mixed hyperlipidemia: Secondary | ICD-10-CM

## 2013-04-30 DIAGNOSIS — I10 Essential (primary) hypertension: Secondary | ICD-10-CM

## 2013-04-30 DIAGNOSIS — R413 Other amnesia: Secondary | ICD-10-CM

## 2013-04-30 NOTE — Patient Instructions (Signed)
Dr Herbie BaltimoreHarding has recommended making the following medication changes:  STOP SIMVASTATIN.  Dr Herbie BaltimoreHarding wants you have labs checked (Lipid, LFT) at your earliest convenience.  We will recheck your cholesterol in 3 months. Someone will call you to remind you to have your labs checked and then later with the results.  Dr Herbie BaltimoreHarding wants you to follow-up in 1 year. You will receive a reminder letter in the mail two months in advance. If you don't receive a letter, please call our office to schedule the follow-up appointment.

## 2013-05-01 LAB — HEPATIC FUNCTION PANEL
ALBUMIN: 4.2 g/dL (ref 3.5–5.2)
ALT: 15 U/L (ref 0–35)
AST: 23 U/L (ref 0–37)
Alkaline Phosphatase: 72 U/L (ref 39–117)
BILIRUBIN INDIRECT: 0.6 mg/dL (ref 0.2–1.2)
Bilirubin, Direct: 0.1 mg/dL (ref 0.0–0.3)
TOTAL PROTEIN: 6.7 g/dL (ref 6.0–8.3)
Total Bilirubin: 0.7 mg/dL (ref 0.2–1.2)

## 2013-05-01 LAB — LIPID PANEL
CHOL/HDL RATIO: 2.6 ratio
CHOLESTEROL: 158 mg/dL (ref 0–200)
HDL: 61 mg/dL (ref >39)
LDL CALC: 79 mg/dL (ref 0–99)
TRIGLYCERIDES: 89 mg/dL (ref <150)
VLDL: 18 mg/dL (ref 0–40)

## 2013-05-02 ENCOUNTER — Telehealth: Payer: Self-pay | Admitting: *Deleted

## 2013-05-02 ENCOUNTER — Encounter: Payer: Self-pay | Admitting: *Deleted

## 2013-05-02 ENCOUNTER — Encounter: Payer: Self-pay | Admitting: Cardiology

## 2013-05-02 DIAGNOSIS — E782 Mixed hyperlipidemia: Secondary | ICD-10-CM | POA: Insufficient documentation

## 2013-05-02 DIAGNOSIS — I10 Essential (primary) hypertension: Secondary | ICD-10-CM | POA: Insufficient documentation

## 2013-05-02 DIAGNOSIS — Z79899 Other long term (current) drug therapy: Secondary | ICD-10-CM

## 2013-05-02 NOTE — Progress Notes (Signed)
Quick Note:  Labs look Randie HeinzGreat -- this is a good opportunity to Stop the Simvastatin.  Hopefully the levels will remain controlled when we recheck in 3 months. If they drift back up - will consider using Zetia (not a statin & no reported memory effect.  Marykay LexHARDING,Julie Paolini W, MD  ______

## 2013-05-02 NOTE — Telephone Encounter (Signed)
Message copied by Tobin ChadMARTIN, Tyrease Vandeberg V. on Fri May 02, 2013  1:41 PM ------      Message from: Marykay LexHARDING, DAVID W      Created: Fri May 02, 2013 12:47 AM       Labs look Randie HeinzGreat -- this is a good opportunity to Stop the Simvastatin.        Hopefully the levels will remain controlled when we recheck in 3 months.      If they drift back up - will consider using Zetia (not a statin & no reported memory effect.            Marykay LexHARDING,DAVID W, MD       ------

## 2013-05-02 NOTE — Assessment & Plan Note (Signed)
Hypertension really been a problem for her. Her blood pressure is well-controlled.

## 2013-05-02 NOTE — Telephone Encounter (Signed)
Spoke to patient. Result given . Verbalized understanding  Order lipid  for 3 months Per Dr Herbie BaltimoreHarding- patient is aware and will mail labslip.

## 2013-05-02 NOTE — Progress Notes (Signed)
PATIENT: Hannah Soto MRN: [id number]  DOB: 02/11/1941   DOV:05/02/2013 PCP: Gaye Alken, MD  Clinic Note: Chief Complaint  Patient presents with  . Annual Exam    dx  with alzheimers, no chest pain ,no sob ,no edema    HPI: Hannah Soto is a 73 y.o.  female with a PMH below who presents today for followup of her hypercholesterolemia and family history of CAD. I last saw her in March of 2014 into doing relatively well. In the interim since her last visit, she has been diagnosed with Alzheimer's Disease, and is quite upset with this. She is having heart, to grips with what it means and what is coming in the future. She is actually concerned of simvastatin may also be due to memory loss..  Interval History: For cardiac standpoint, she really has no symptoms whatsoever. She has recovered from her recent illness where she had nausea and vomiting kind be it with the medication should be started on. Since stopping that she been doing better. Really since coming off the medicine she says her memory seems to be improving will be to, she still does have short-term memory loss and some loss of function. Cardiac Review Systems:No chest pain or shortness of breath with rest or exertion. No PND, orthopnea or edema. No palpitations, lightheadedness, dizziness, weakness or syncope/near syncope. No TIA/amaurosis fugax symptoms. No melena, hematochezia hematuria. No nosebleed No claudication  Past Medical History  Diagnosis Date  . Memory loss   . Hyperlipidemia     Prior Cardiac Evaluation and Past Surgical History: Past Surgical History  Procedure Laterality Date  . Doppler echocardiography  Apr 07, 2010    NORMAL ECHO ,EF >55%,MILD IMPAIRED RELAXATION,MILD MR AND TR  . Nm myocar single w/spect  Apr 07 2010    EF 75% WITH NO ISCHEMIA OR INFARCTION  . Cardiac catheterization  10/2008    SHOWED LEFT DOMINANT SYSTEM WITH NO SIGNIFICANT DISEASE    Allergies  Allergen Reactions  .  Ciprofloxacin Other (See Comments)    Can't remember.  . Codeine     Can't remember   . Levofloxacin     Can't remember  . Niaspan [Niacin] Other (See Comments)    intolerant  . Sulfa Antibiotics   . Vytorin [Ezetimibe-Simvastatin] Other (See Comments)    myalgias    Current Outpatient Prescriptions  Medication Sig Dispense Refill  . aspirin EC 81 MG tablet Take 81 mg by mouth daily.      . benzonatate (TESSALON) 100 MG capsule       . glucosamine-chondroitin 500-400 MG tablet Take 1 tablet by mouth 4 (four) times daily.      Marland Kitchen ibuprofen (ADVIL,MOTRIN) 100 MG chewable tablet Chew 100 mg by mouth every 8 (eight) hours as needed for fever.      . Multiple Vitamins-Minerals (CENTRUM SILVER ULTRA WOMENS) TABS Take 1 tablet by mouth daily.      . simvastatin (ZOCOR) 40 MG tablet       . BOOSTRIX 5-2.5-18.5 injection       . donepezil (ARICEPT) 10 MG tablet TAKE ONE TABLET AT BEDTIME.  30 tablet  6   No current facility-administered medications for this visit.   she reports that she is not taking Aricept  History   Social History Narrative   She is a married, mother of 4, grandmother 3. Up until her recent diagnosis with Alzheimer's, she been quite active. She still tries to be she does water aerobics  and walks. She does not smoke, and has occasional alcoholic beverages.   ROS: A comprehensive Review of Systems - Negative except Her anxiety and fear as well as memory loss concerns. She is very anxious and concerned about her diagnosis. She seemed very unsure of how she will have to proceed.  PHYSICAL EXAM BP 118/60  Pulse 64  Ht 5\' 3"  (1.6 m)  Wt 132 lb 8 oz (60.102 kg)  BMI 23.48 kg/m2 General appearance: alert, cooperative, appears stated age, no distress and Anxious and almost sad appearing. Somewhat blunted mood and affect. She does answer questions to the best of her ability. Neck: no adenopathy, no carotid bruit, no JVD, supple, symmetrical, trachea midline and thyroid not  enlarged, symmetric, no tenderness/mass/nodules Lungs: clear to auscultation bilaterally, normal percussion bilaterally and Nonlabored, good air movement Heart: regular rate and rhythm, S1, S2 normal, no murmur, click, rub or gallop and normal apical impulse Abdomen: soft, non-tender; bowel sounds normal; no masses,  no organomegaly Extremities: extremities normal, atraumatic, no cyanosis or edema and no ulcers, gangrene or trophic changes Pulses: 2+ and symmetric Neurologic: Mental status: Alert, oriented, thought content appropriate, affect: blunted Cranial nerves: normal   Adult ECG Report  Rate: 64 ;  Rhythm: normal sinus rhythm  QRS Axis: 33 ;  PR Interval: 146 ms ;  QRS Duration: 68 ms ; QTc: 447 ms  Voltages: Normal  Conduction Disturbances: none;  Other Abnormalities: none   Narrative Interpretation: Essentially normal EKG  Recent Labs: None available  ASSESSMENT / PLAN: Mixed hyperlipidemia She has been on simvastatin for a long period time. It's been a while since she has had lipids and LFTs checked. All then check a set now. My plan will be for her to build stop simvastatin and definitely for her concerns of memory loss. There is certainly some literature suggesting that simvastatin may be associated with memory loss, and anything that would help is reasonable as coronary disease in the setting of dementia is less pertinent. Recheck labs in 3 months  Memory loss We will stop simvastatin.  Depending on what recheck labs show we may or may not restart her back on statin of another type.  I then spent at least 15-20 minutes simply talking to the patient about her diagnosis and her concerns. I provided emotional support and made recommendations as to various options for support groups that will help both herself and her husband as a transition into this stage of life.  HTN (hypertension) Hypertension really been a problem for her. Her blood pressure is  well-controlled.   Total patient time: 35-45 minutes  Orders Placed This Encounter  Procedures  . Lipid panel  . Hepatic function panel  . Lipid panel    Standing Status: Future     Number of Occurrences:      Standing Expiration Date: 05/01/2014    Order Specific Question:  Has the patient fasted?    Answer:  Yes  . EKG 12-Lead   Meds ordered this encounter  Medications  . aspirin EC 81 MG tablet    Sig: Take 81 mg by mouth daily.    Followup: One year  Kendrea Cerritos W. Herbie BaltimoreHARDING, M.D., M.S. Interventional Cardiology [facility]

## 2013-05-02 NOTE — Assessment & Plan Note (Addendum)
She has been on simvastatin for a long period time. It's been a while since she has had lipids and LFTs checked. All then check a set now. My plan will be for her to build stop simvastatin and definitely for her concerns of memory loss. There is certainly some literature suggesting that simvastatin may be associated with memory loss, and anything that would help is reasonable as coronary disease in the setting of dementia is less pertinent. Recheck labs in 3 months

## 2013-05-02 NOTE — Assessment & Plan Note (Addendum)
We will stop simvastatin.  Depending on what recheck labs show we may or may not restart her back on statin of another type.  I then spent at least 15-20 minutes simply talking to the patient about her diagnosis and her concerns. I provided emotional support and made recommendations as to various options for support groups that will help both herself and her husband as a transition into this stage of life.

## 2013-06-02 ENCOUNTER — Telehealth: Payer: Self-pay | Admitting: Cardiology

## 2013-06-02 NOTE — Telephone Encounter (Signed)
Patient informed lab not due til June 2015[facility]  Per notes from Dr. Donneta RombergHarding/Sharon, RN

## 2013-06-16 ENCOUNTER — Telehealth: Payer: Self-pay | Admitting: Neurology

## 2013-06-16 ENCOUNTER — Ambulatory Visit: Payer: Medicare Other | Admitting: Neurology

## 2013-06-16 NOTE — Telephone Encounter (Signed)
Patient has an appt today with Dr. Frances FurbishAthar @ 12:00--FYI husband states patient is very weak and her memory is much worse--patient was seen @ Urgent Care over the weekend for diarrhea but that is not an issue now--patient's husband will be coming with her to appt today--husband just wanted us to be aware of her condition before the appt today--thank you.

## 2013-06-17 ENCOUNTER — Ambulatory Visit (INDEPENDENT_AMBULATORY_CARE_PROVIDER_SITE_OTHER): Payer: Medicare Other | Admitting: Neurology

## 2013-06-17 ENCOUNTER — Encounter: Payer: Self-pay | Admitting: Neurology

## 2013-06-17 VITALS — BP 130/76 | HR 61 | Temp 97.0°F | Ht 63.0 in | Wt 126.0 lb

## 2013-06-17 DIAGNOSIS — F0280 Dementia in other diseases classified elsewhere without behavioral disturbance: Secondary | ICD-10-CM

## 2013-06-17 DIAGNOSIS — G309 Alzheimer's disease, unspecified: Principal | ICD-10-CM

## 2013-06-17 MED ORDER — DONEPEZIL HCL 10 MG PO TABS: ORAL_TABLET | ORAL | Status: DC

## 2013-06-17 MED ORDER — MEMANTINE HCL ER 7 MG PO CP24: 7.0000 mg | ORAL_CAPSULE | Freq: Every day | ORAL | Status: DC

## 2013-06-17 NOTE — Patient Instructions (Addendum)
We will continue with donepezil 10 mg daily. I would like to suggest starting you on a second memory medication, Namenda XR, starting at 7 mg once daily with gradual build up. Side effects include: nausea, confusion, hallucination, personality changes. If you are having mild side effects, try to stick with the treatment as these initial side effects may go away after the first 10-14 days.     Dr. Herbie BaltimoreHarding took you off of simvastatin on 05/02/13 and your cholesterol looked great. You will have to get it re-checked in June to see if numbers continue to look good without the simvastatin.   Call in 1 month and we can discuss increasing the Namenda XR to the next dose, which would be 14 mg.  Follow up in 4 months, but please call in 1 month.

## 2013-06-17 NOTE — Progress Notes (Signed)
Subjective:    Patient ID: Hannah Soto is a 73 y.o. female.  HPI    Interim history:   Ms. Hannah Soto is a very pleasant 73 year old right-handed lady with an underlying medical history of hyperlipidemia, osteopenia, arthritis, cataracts, nephrolithiasis, status post right wrist surgery and pelvic prolapse surgery, who presents for FU consultation of her memory loss c/w AD. She is accompanied by her husband again today. I last saw her on 12/17/2012, at which time we talked about her test results including her cognitive testing. I asked her to continue on Aricept 10 mg strength. She was tolerating this well but did report some mild intermittent diarrhea, which was tolerable to her.   Today, she reports no new issues. Her husband reports that he took her to the walk-in clinic for nausea, feeling globally weak. She has lost about 13 lb since her last visit.   I saw her on 09/16/2012, at which time I reinforced the importance of taking the Aricept daily. I also increase it from 5 to10 mg daily. We also talked about her recent brain scan results. In addition, I referred her for formal cognitive testing. She met with Dr. Valentina Shaggy on 11/11/2012 and I reviewed his neuropsychological report with them: Neurocognitive testing indicated global cognitive compromise, mostly to a severe degree, no indications of mood disturbance, difficulties with psychosocial adjustment or behavioral problems. Dr. Valentina Shaggy felt that she probably had dementia of the Alzheimer's type. 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 She needed line of sight supervision while cooking. He felt that her ability to safely drive was questionable. He felt that she did not necessarily had sufficient mental capacity to make complex decisions in her own interests and suggested that her husband seek power of attorney for her finances as well as her health (he already has health care POA). He also  suggested that they involve their adult children more and her wife's health situation.  I first met her on 06/11/2012, at which time she reported memory loss over the past several months. Her MMSE in her primary care physician's office on 05/23/2012 was 22/29. and she had trouble with clock drawing. Blood work with her PCP had shown a normal CBC, normal CMP, normal TSH and nonreactive RPR. At the time of her first visit with me I suggested a brain MRI and MRA, which had on 06/24/12: Moderate-severe mesial temporal and hippocampal atrophy and MRA showed: Right middle cerebral artery M2 branches have decreased signal on 3D reconstuctions. These branches appear normal on axial views. The M3 and distal branches appear normal. Therefore this likely represents artifact. 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 Remainder of medium to large sized vessels appear normal.  Her MMSE was 20/30 on 06/11/12 and I suggested starting her on Aricept 5 mg daily, but she was taking it more on an as-needed basis, indicating that she does took it on more days than not. Her husband indicated that he had not been overseeing her medications regularly. She had no problems tolerating Aricept at the lower dose.      Her Past Medical History Is Significant For: Past Medical History  Diagnosis Date  . Memory loss   . Hyperlipidemia     Her Past Surgical History Is Significant For: Past Surgical History  Procedure Laterality Date  . Doppler echocardiography  Apr 07, 2010    NORMAL ECHO ,EF >55%,MILD IMPAIRED RELAXATION,MILD MR AND TR  .  Nm myocar single w/spect  Apr 07 2010    EF 75% WITH NO ISCHEMIA OR INFARCTION  . Cardiac catheterization  10/2008    SHOWED LEFT DOMINANT SYSTEM WITH NO SIGNIFICANT DISEASE    Her Family History Is Significant For: Family History  Problem Relation Age of Onset  . Heart attack Father     Her Social History Is Significant  For: History   Social History  . Marital Status: Married    Spouse Name: N/A    Number of Children: N/A  . Years of Education: N/A   Social History Main Topics  . Smoking status: Never Smoker   . Smokeless tobacco: None  . Alcohol Use: 2.0 oz/week    4 drink(s) per week  . Drug Use: No  . Sexual Activity: None   Other Topics Concern  . None   Social History Narrative   She is a married, mother of 56, grandmother 3. Up until her recent diagnosis with Alzheimer's, she been quite active. She still tries to be she does water aerobics and walks. She does not smoke, and has occasional alcoholic beverages.    Her Allergies Are:  Allergies  Allergen Reactions  . Ciprofloxacin Other (See Comments)    Can't remember.  . Codeine     Can't remember   . Levofloxacin     Can't remember  . Niaspan [Niacin] Other (See Comments)    intolerant  . Sulfa Antibiotics   . Vytorin [Ezetimibe-Simvastatin] Other (See Comments)    myalgias  :   Her Current Medications Are:  Outpatient Encounter Prescriptions as of 06/17/2013  Medication Sig  . aspirin EC 81 MG tablet Take 81 mg by mouth daily.  . benzonatate (TESSALON) 100 MG capsule   . BOOSTRIX 5-2.5-18.5 injection   . donepezil (ARICEPT) 10 MG tablet TAKE ONE TABLET AT BEDTIME.  Marland Kitchen glucosamine-chondroitin 500-400 MG tablet Take 1 tablet by mouth 4 (four) times daily.  . Multiple Vitamins-Minerals (CENTRUM SILVER ULTRA WOMENS) TABS Take 1 tablet by mouth daily.  . simvastatin (ZOCOR) 40 MG tablet Take 40 mg by mouth daily at 6 PM.   . ibuprofen (ADVIL,MOTRIN) 100 MG chewable tablet Chew 100 mg by mouth every 8 (eight) hours as needed for fever.  . ondansetron (ZOFRAN) 4 MG tablet Take 1 tablet by mouth daily.  :  Review of Systems:  Out of a complete 14 point review of systems, all are reviewed and negative with the exception of these symptoms as listed below:   Review of Systems  Constitutional: Positive for unexpected weight change.   HENT: Positive for rhinorrhea.   Eyes: Negative.   Respiratory: Negative.   Cardiovascular: Negative.   Gastrointestinal: Negative.   Endocrine: Negative.   Genitourinary: Negative.   Musculoskeletal: Negative.   Skin: Negative.   Allergic/Immunologic: Negative.   Neurological: Positive for weakness and headaches.       Memory loss  Hematological: Negative.   Psychiatric/Behavioral: Positive for confusion.    Objective:  Neurologic Exam  Physical Exam Physical Examination:   Filed Vitals:   06/17/13 0939  BP: 130/76  Pulse: 61  Temp: 97 F (36.1 C)    General Examination: The patient is a very pleasant 73 y.o. female in no acute distress. She is calm and cooperative with the exam. She denies Auditory Hallucinations and Visual Hallucinations.   HEENT: Normocephalic, atraumatic, pupils are equal, round and reactive to light and accommodation. Extraocular tracking shows mild saccadic breakdown without nystagmus noted. Hearing is  intact. Funduscopic exam is normal. Face is symmetric with no facial masking and normal facial sensation. There is no lip, neck or jaw tremor. Neck is not rigid with intact passive ROM. There are no carotid bruits on auscultation. Oropharynx exam reveals mild mouth dryness. No significant airway crowding is noted. Mallampati is class II. Tongue protrudes centrally and palate elevates symmetrically.    Chest: is clear to auscultation without wheezing, rhonchi or crackles noted.  Heart: sounds are regular and normal without murmurs, rubs or gallops noted.   Abdomen: is soft, non-tender and non-distended with normal bowel sounds appreciated on auscultation.  Extremities: There is no pitting edema in the distal lower extremities bilaterally. Pedal pulses are intact.  Skin: is warm and dry with no trophic changes noted.  Musculoskeletal: exam reveals no obvious joint deformities, tenderness or joint swelling or erythema.  Neurologically:  Mental  status: The patient is awake and alert, paying good  attention. She is able to to partially provide the history. Her husband provides details. She is oriented to: person, situation and day of week, city, county, state, not exact date, but month, not year. Her memory, attention, language and knowledge are impaired. There is no aphasia, agnosia, apraxia or anomia. There is a mild degree of bradyphrenia. Speech is mildly hypophonic with no dysarthria noted. Mood is congruent and affect is normal.  On 06/11/2012: Her MMSE score was 20/30. CDT was 3/4. AFT (Animal Fluency Test) score was 5.  On 12/17/12: CDT was 2/4, AFT 7/min.  On 06/17/13: MMSE 20/30, AFT 3/min, CDT 2/4.  Cranial nerves are as described above under HEENT exam. In addition, shoulder shrug is normal with equal shoulder height noted.  Motor exam: Normal bulk, and strength for age is noted. Tone is not rigid with absence of cogwheeling in the bilateral extremities. There is overall no bradykinesia. There is no drift or rebound. There is no tremor. Romberg is negative. Reflexes are 2+ in the upper extremities and 1+ in the lower extremities. Fine motor skills: Finger taps, hand movements, and rapid alternating patting are not significantly impaired bilaterally. Foot taps and foot agility are not impaired bilaterally.   Cerebellar testing shows no dysmetria or intention tremor on finger to nose testing. There is no truncal or gait ataxia.   Sensory exam is intact to light touch, pinprick, vibration, temperature sense in the upper and lower extremities.   Gait, station and balance: She stands up from the seated position with no difficulty. No veering to one side is noted. No leaning to one side. Posture is age appropriate stooped. Stance is narrow-based. She turns en bloc. Tandem walk is normal. Balance is not significantly impaired.   Assessment and Plan:   In summary, NATALINE BASARA is a very pleasant 73 year old female with a history of  memory loss, concerning for Alzheimer's disease with progression noted in her slowness in processing and her slowness in thinking, but MMSE was stable at 20/30, and her AFT was lower today. I talked to her and her husband again about her recent test results including her cognitive testing and today's scores. She has been able to tolerate Aricept 10 mg, but I would like to introduce Namenda XR today. We will start at 7 mg strength with gradual buildup. I've asked him to call back in about a month to report an update at which time we can discuss increasing to the next strength, namely 14 mg daily of Namenda XR. Down the road we will also repeat  her cognitive testing for comparison. Her physical exam is stable and I reassured the patient and her husband. The only change that is significant enough is her weight loss. She has lost about 13 pounds since our last visit. She is encouraged to drink more water and increase her nutrition and protein intake. Her husband reports that she has a reasonable appetite but does not always finish her food. If she does not like to eat bigger portions she should go to to eating more frequently during the day. They were in agreement. In March she was taken off of simvastatin. She will get it rechecked in June per Dr. Ellyn Hack. Her cholesterol levels were great at the time. I encouraged the patient to eat healthy, exercise daily and keep well hydrated, to keep a scheduled bedtime and wake time routine, to not skip any meals and eat healthy snacks in between meals and to have protein with every meal. I stressed the importance of regular exercise, within of course the patient's own mobility limitations. I encouraged the patient to keep up with current events by reading the news paper or watching the news and to do word puzzles, or if feasible, to go on [email or web address].   As far as further diagnostic testing is concerned, I suggested the following: no new test today. As far as medications are  concerned, I recommended the above. I answered all their questions today and they  were in agreement with the above outlined plan. I would like to see the patient back in 4 months, sooner if the need arises and encouraged them to call with any interim questions, concerns, problems, updates and refill requests.

## 2013-06-30 ENCOUNTER — Telehealth: Payer: Self-pay

## 2013-06-30 DIAGNOSIS — E782 Mixed hyperlipidemia: Secondary | ICD-10-CM

## 2013-06-30 NOTE — Telephone Encounter (Signed)
Called patient to remind her of labs and to let her know I'd be putting a lab slip in the mail.

## 2013-06-30 NOTE — Telephone Encounter (Signed)
Message copied by Neta EhlersRUITT, ANGELA M on Mon Jun 30, 2013  5:21 PM ------      Message from: Neta EhlersRUITT, ANGELA M      Created: Wed Apr 30, 2013  4:39 PM      Regarding: LABS       Call patient in May 2015 and mail lab slip for lipid panel.            Patient expected to do lipid in June. ------

## 2013-07-01 ENCOUNTER — Telehealth: Payer: Self-pay | Admitting: Cardiology

## 2013-07-01 NOTE — Telephone Encounter (Signed)
Please call,pt says she is confused. She said all her medicine was changed and she thought she was suppose to have some lab work.

## 2013-07-01 NOTE — Telephone Encounter (Signed)
New phone number to reach patient [phone]

## 2013-07-01 NOTE — Telephone Encounter (Signed)
Spoke to husband and wife. Patient stated she has not received lab slip in the mail  RN informed patient it may take 7-10 days it has to go through internal mail before [employer] postal service. Patient and husband aware.

## 2013-07-01 NOTE — Telephone Encounter (Signed)
She wants to know when she is suppose to go for her lab work and anything she needs to know please.

## 2013-07-04 NOTE — Progress Notes (Signed)
Not my patient. Please send if seen or patient at [facility] psychiat[facility].

## 2013-07-17 LAB — LIPID PANEL
CHOLESTEROL: 227 mg/dL — AB (ref 0–200)
HDL: 54 mg/dL (ref >39)
LDL CALC: 149 mg/dL — AB (ref 0–99)
Total CHOL/HDL Ratio: 4.2 Ratio
Triglycerides: 118 mg/dL (ref <150)
VLDL: 24 mg/dL (ref 0–40)

## 2013-07-18 ENCOUNTER — Encounter: Payer: Self-pay | Admitting: *Deleted

## 2013-07-18 ENCOUNTER — Telehealth: Payer: Self-pay | Admitting: *Deleted

## 2013-07-18 NOTE — Telephone Encounter (Signed)
Message copied by Tobin Chad on Fri Jul 18, 2013  8:37 AM ------      Message from: Marykay Lex      Created: Thu Jul 17, 2013  6:54 PM       Cholesterol has definitely gone up while off of Simvastatin.        I think that we may need to switch to one that has less of a track record for memory difficulties.      Will discuss with Phillips Hay (our pharmacist) re: options.            Marykay Lex, MD       ------

## 2013-07-18 NOTE — Telephone Encounter (Signed)
Spoke to patient. Result given . Verbalized understanding She is aware RN WILL CALL HER BACK WITH THE NEW MEDICATION AFTER A DECISION IS MADE.

## 2013-07-21 ENCOUNTER — Telehealth: Payer: Self-pay | Admitting: Neurology

## 2013-07-21 DIAGNOSIS — G309 Alzheimer's disease, unspecified: Principal | ICD-10-CM

## 2013-07-21 DIAGNOSIS — F0280 Dementia in other diseases classified elsewhere without behavioral disturbance: Secondary | ICD-10-CM

## 2013-07-21 MED ORDER — MEMANTINE HCL ER 14 MG PO CP24: 14.0000 mg | ORAL_CAPSULE | Freq: Every day | ORAL | Status: DC

## 2013-07-21 NOTE — Telephone Encounter (Signed)
Patient's husband calling to discuss an increase in Namenda per Dr. Frances Furbish.

## 2013-07-21 NOTE — Telephone Encounter (Signed)
Pt's husband is calling requesting an increase in the pt's namenda. Please advise

## 2013-07-21 NOTE — Telephone Encounter (Signed)
Prescription sent to pharmacy on file for Namenda long-acting 14 mg once daily - please call husband and advised him the prescription should be ready for pickup. She will use the 14 mg strength, no longer the 7 mg strength. If then have a lot of the 7 mg pills left, she can take 2 of the 7 mg strength pills to make 14 mg once daily and then go to the new prescription.

## 2013-07-22 MED ORDER — EZETIMIBE 10 MG PO TABS: 10.0000 mg | ORAL_TABLET | Freq: Every day | ORAL | Status: DC

## 2013-07-22 NOTE — Telephone Encounter (Signed)
RN spoke with  Dr Herbie Baltimore. Per Dr Herbie Baltimore, start Zetia 10 mg daily. RN spoke to patient. Patient wrote the information down. Verbalized understanding. E-sent prescription.

## 2013-07-22 NOTE — Telephone Encounter (Signed)
Please ask patient's husband's what his concerns may be. I may not be able to call him today, please takes him message.

## 2013-07-22 NOTE — Telephone Encounter (Signed)
LMVM for husband to return call.   I need more information from him.

## 2013-07-22 NOTE — Telephone Encounter (Signed)
Called pt's husband Pruette to inform him per Dr. Frances Furbish that the Rx was sent in with increase. Husband stated that he would like to talk with Dr. Frances Furbish concerning this increase, before the pt even start it. Pt's husband would like for Dr. Frances Furbish give him a call please thanks.

## 2013-07-23 ENCOUNTER — Telehealth: Payer: Self-pay | Admitting: Pharmacist Clinician (PhC)/ Clinical Pharmacy Specialist

## 2013-07-23 NOTE — Telephone Encounter (Signed)
Pt already spoke with Lamonte Richer.  Did have concern that would interfere with new med given by Neurology - Namenda.  Assured her that these were compatible.

## 2013-07-23 NOTE — Telephone Encounter (Signed)
Pruette called back and pt tolerating the namenda XR 7 mg well now, no more GI issues as initially.  He was asking what benefit.  I told him that the 14mg  dosage is maintenance for this drug. The drug is not a cure but hopefully stablize dementia.  He verbalized understanding and will try the increased dose.

## 2013-07-23 NOTE — Telephone Encounter (Signed)
Message copied by Rosalee Kaufman on Wed Jul 23, 2013  8:53 AM ------      Message from: Herbie Baltimore, DAVID W      Created: Mon Jul 21, 2013  6:06 PM       OK - lets try Zetia.                  ----- Message -----         From: Phillips Hay, RPH-CPP         Sent: 07/21/2013   4:05 PM           To: Marykay Lex, MD            All statins and now PCSK9s are linked to memory issues.  If don't want to do Crestor 5mg  twice weekly, Zetia is really only good option.  Belenda Cruise       ------

## 2013-07-30 ENCOUNTER — Other Ambulatory Visit: Payer: Self-pay | Admitting: Gastroenterology

## 2013-07-30 DIAGNOSIS — R634 Abnormal weight loss: Secondary | ICD-10-CM

## 2013-08-06 ENCOUNTER — Other Ambulatory Visit: Payer: Self-pay | Admitting: Gastroenterology

## 2013-08-08 ENCOUNTER — Inpatient Hospital Stay: Admission: RE | Admit: 2013-08-08 | Payer: 59 | Source: Ambulatory Visit

## 2013-08-08 ENCOUNTER — Ambulatory Visit
Admission: RE | Admit: 2013-08-08 | Discharge: 2013-08-08 | Disposition: A | Discharge status: Home / Self Care | Payer: Medicare Other | Source: Ambulatory Visit | Attending: Gastroenterology | Admitting: Gastroenterology

## 2013-08-08 DIAGNOSIS — R634 Abnormal weight loss: Secondary | ICD-10-CM

## 2013-08-08 MED ORDER — IOHEXOL 300 MG/ML  SOLN
100.0000 mL | Freq: Once | INTRAMUSCULAR | Status: AC | PRN
  Administered 2013-08-08: 100 mL via INTRAVENOUS

## 2013-11-13 ENCOUNTER — Ambulatory Visit (INDEPENDENT_AMBULATORY_CARE_PROVIDER_SITE_OTHER): Payer: Medicare Other | Admitting: Neurology

## 2013-11-13 ENCOUNTER — Encounter: Payer: Self-pay | Admitting: Neurology

## 2013-11-13 VITALS — BP 121/70 | HR 71 | Temp 97.4°F | Resp 16 | Ht 62.5 in | Wt 128.2 lb

## 2013-11-13 DIAGNOSIS — G309 Alzheimer's disease, unspecified: Secondary | ICD-10-CM

## 2013-11-13 DIAGNOSIS — F0280 Dementia in other diseases classified elsewhere without behavioral disturbance: Secondary | ICD-10-CM

## 2013-11-13 MED ORDER — MEMANTINE HCL ER 21 MG PO CP24: 21.0000 mg | ORAL_CAPSULE | Freq: Every day | ORAL | Status: DC

## 2013-11-13 MED ORDER — DONEPEZIL HCL 10 MG PO TABS: ORAL_TABLET | ORAL | Status: DC

## 2013-11-13 NOTE — Patient Instructions (Signed)
I think overall you are doing fairly well but I do want to suggest a few things today:  Remember to drink plenty of fluid, eat healthy meals and do not skip any meals. Try to eat protein with a every meal and eat a healthy snack such as fruit or nuts in between meals. Try to keep a regular sleep-wake schedule and try to exercise daily, particularly in the form of walking, 20-30 minutes a day, if you can. Good nutrition, proper sleep and exercise can help her cognitive function.  Engage in social activities in your community and with your family and try to keep up with current events by reading the newspaper or watching the news. If you have computer and can go online, try [email or web address]. Also, you may like to do word finding puzzles or crossword puzzles.  As far as your medications are concerned, I would like to suggest:    As far as diagnostic testing: no new test needed.   I would like to suggest a 3 month follow up with Ms. Lam, NP and I will see you back after that.   Our phone number is [phone]. We also have an after hours call service for urgent matters and there is a physician on-call for urgent questions. For any emergencies you know to call 911 or go to the nearest emergency room.

## 2013-11-13 NOTE — Progress Notes (Signed)
Subjective:    Patient ID: Hannah Soto is a 73 y.o. female.  HPI   Interim history:   Hannah Soto is a very pleasant 73 year old right-handed lady with an underlying medical history of hyperlipidemia, osteopenia, arthritis, cataracts, nephrolithiasis, status post right wrist surgery and pelvic prolapse surgery, who presents for FU consultation of her memory loss c/w AD. She is accompanied by her husband again today. I last saw her on 06/17/2013, at which time I suggested we continue with donepezil 10 mg daily and I suggested that she start Namenda long-acting at 7 mg strength. I suggested she have her cholesterol checked with her primary care physician as she had stopped taking her simvastatin in March 2015. She had her cholesterol rechecked in June and it had increased. She was started on Zetia by her primary care physician. In the interim, her husband called back in June reporting that she was able to tolerate the starting dose of Namenda XR. I suggested that we increase the dose to 14 mg once daily.  Today, she reports feeling well. She has been able to tolerate the Namenda XR at 14 mg. They have one son and one daughter and three grandchildren. She has had no recent illness. Her appetite is good and sleeps well. She drinks one glass of wine per night.   I saw her on 12/17/2012, at which time we talked about her test results including her cognitive testing. I asked her to continue on Aricept 10 mg strength. She was tolerating this well but did report some mild intermittent diarrhea, which was tolerable to her.   I saw her on 09/16/2012, at which time I reinforced the importance of taking the Aricept daily. I also increase it from 5 to10 mg daily. We also talked about her recent brain scan results. In addition, I referred her for formal cognitive testing. She met with Dr. Valentina Shaggy on 11/11/2012 and I reviewed his neuropsychological report with them: Neurocognitive testing indicated global cognitive  compromise, mostly to a severe degree, no indications of mood disturbance, difficulties with psychosocial adjustment or behavioral problems. Dr. Valentina Shaggy felt that she probably had dementia of the Alzheimer's type. 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 She needed line of sight supervision while cooking. He felt that her ability to safely drive was questionable. He felt that she did not necessarily had sufficient mental capacity to make complex decisions in her own interests and suggested that her husband seek power of attorney for her finances as well as her health (he already has health care POA). He also suggested that they involve their adult children more and her wife's health situation.  I first met her on 06/11/2012, at which time she reported memory loss over the past several months. Her MMSE in her primary care physician's office on 05/23/2012 was 22/29. and she had trouble with clock drawing. Blood work with her PCP had shown a normal CBC, normal CMP, normal TSH and nonreactive RPR. At the time of her first visit with me I suggested a brain MRI and MRA, which had on 06/24/12: Moderate-severe mesial temporal and hippocampal atrophy and MRA showed: Right middle cerebral artery M2 branches have decreased signal on 3D reconstuctions. These branches appear normal on axial views. The M3 and distal branches appear normal. Therefore this likely represents artifact. 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 Remainder of medium to large sized vessels appear normal.  Her  MMSE was 20/30 on 06/11/12 and I suggested starting her on Aricept 5 mg daily, but she was taking it more on an as-needed basis, indicating that she does took it on more days than not. Her husband indicated that he had not been overseeing her medications regularly. She had no problems tolerating Aricept at the lower dose.   Her Past  Medical History Is Significant For: Past Medical History  Diagnosis Date  . Memory loss   . Hyperlipidemia     Her Past Surgical History Is Significant For: Past Surgical History  Procedure Laterality Date  . Doppler echocardiography  Apr 07, 2010    NORMAL ECHO ,EF >55%,MILD IMPAIRED RELAXATION,MILD MR AND TR  . Nm myocar single w/spect  Apr 07 2010    EF 75% WITH NO ISCHEMIA OR INFARCTION  . Cardiac catheterization  10/2008    SHOWED LEFT DOMINANT SYSTEM WITH NO SIGNIFICANT DISEASE    Her Family History Is Significant For: Family History  Problem Relation Age of Onset  . Heart attack Father     Her Social History Is Significant For: History   Social History  . Marital Status: Married    Spouse Name: N/A    Number of Children: N/A  . Years of Education: N/A   Social History Main Topics  . Smoking status: Never Smoker   . Smokeless tobacco: None  . Alcohol Use: 2.0 oz/week    4 drink(s) per week  . Drug Use: No  . Sexual Activity: None   Other Topics Concern  . None   Social History Narrative   She is a married, mother of 73, grandmother 3. Up until her recent diagnosis with Alzheimer's, she been quite active. She still tries to be she does water aerobics and walks. She does not smoke, and has occasional alcoholic beverages.    His Allergies Are:  Allergies  Allergen Reactions  . Ciprofloxacin Other (See Comments)    Can't remember.  . Codeine     Can't remember   . Levofloxacin     Can't remember  . Niaspan [Niacin] Other (See Comments)    intolerant  . Sulfa Antibiotics   . Vytorin [Ezetimibe-Simvastatin] Other (See Comments)    myalgias  :   Her Current Medications Are:  Outpatient Encounter Prescriptions as of 11/13/2013  Medication Sig  . aspirin EC 81 MG tablet Take 81 mg by mouth daily.  Marland Kitchen BOOSTRIX 5-2.5-18.5 injection   . donepezil (ARICEPT) 10 MG tablet TAKE ONE TABLET AT BEDTIME.  Marland Kitchen ezetimibe (ZETIA) 10 MG tablet Take 1 tablet (10 mg  total) by mouth daily.  Marland Kitchen glucosamine-chondroitin 500-400 MG tablet Take 1 tablet by mouth 4 (four) times daily.  Marland Kitchen ibuprofen (ADVIL,MOTRIN) 100 MG chewable tablet Chew 100 mg by mouth every 8 (eight) hours as needed for fever.  . Memantine HCl ER (NAMENDA XR) 14 MG CP24 Take 1 tablet (14 mg total) by mouth daily.  . Multiple Vitamins-Minerals (CENTRUM SILVER ULTRA WOMENS) TABS Take 1 tablet by mouth daily.  . ondansetron (ZOFRAN) 4 MG tablet Take 1 tablet by mouth daily.  . simvastatin (ZOCOR) 40 MG tablet Take 40 mg by mouth daily at 6 PM.   . benzonatate (TESSALON) 100 MG capsule   :  Review of Systems:  Out of a complete 14 point review of systems, all are reviewed and negative with the exception of these symptoms as listed below:   Review of Systems  Neurological:  Memory loss  Psychiatric/Behavioral: Positive for confusion.       Depression    Objective:  Neurologic Exam  Physical Exam Physical Examination:   Filed Vitals:   11/13/13 1446  BP: 121/70  Pulse: 71  Temp: 97.4 F (36.3 C)  Resp: 16    General Examination: The patient is a very pleasant 73 y.o. female in no acute distress. She is calm and cooperative with the exam. She denies Auditory Hallucinations and Visual Hallucinations.   HEENT: Normocephalic, atraumatic, pupils are equal, round and reactive to light and accommodation. Extraocular tracking shows mild saccadic breakdown without nystagmus noted. Hearing is intact. Funduscopic exam is normal. Face is symmetric with no facial masking and normal facial sensation. There is no lip, neck or jaw tremor. Neck is not rigid with intact passive ROM. There are no carotid bruits on auscultation. Oropharynx exam reveals mild mouth dryness. No significant airway crowding is noted. Mallampati is class II. Tongue protrudes centrally and palate elevates symmetrically.    Chest: is clear to auscultation without wheezing, rhonchi or crackles noted.  Heart: sounds are  regular and normal without murmurs, rubs or gallops noted.   Abdomen: is soft, non-tender and non-distended with normal bowel sounds appreciated on auscultation.  Extremities: There is no pitting edema in the distal lower extremities bilaterally. Pedal pulses are intact.  Skin: is warm and dry with no trophic changes noted.  Musculoskeletal: exam reveals no obvious joint deformities, tenderness or joint swelling or erythema.  Neurologically:  Mental status: The patient is awake and alert, paying good  attention. She is able to to partially provide the history. Her husband provides details. She is oriented to: person, situation and day of week, city, county, state, not exact date, but month, not year. Her memory, attention, language and knowledge are impaired. There is no aphasia, agnosia, apraxia or anomia. There is a mild degree of bradyphrenia. Speech is mildly hypophonic with no dysarthria noted. Mood is congruent and affect is normal.  On 06/11/2012: Her MMSE score was 20/30. CDT was 3/4. AFT (Animal Fluency Test) score was 5.  On 12/17/12: CDT was 2/4, AFT 7/min.  On 06/17/13: MMSE 20/30, AFT 3/min, CDT 2/4. On 11/13/2013: MMSE: 18/30, CDT 2/4, AFT: 7/min   Cranial nerves are as described above under HEENT exam. In addition, shoulder shrug is normal with equal shoulder height noted.  Motor exam: Normal bulk, and strength for age is noted. Tone is not rigid with absence of cogwheeling in the bilateral extremities. There is overall no bradykinesia. There is no drift or rebound. There is no tremor. Romberg is negative. Reflexes are 2+ in the upper extremities and 1+ in the lower extremities. Fine motor skills: Finger taps, hand movements, and rapid alternating patting are not significantly impaired bilaterally. Foot taps and foot agility are not impaired bilaterally.   Cerebellar testing shows no dysmetria or intention tremor on finger to nose testing. There is no truncal or gait ataxia.    Sensory exam is intact to light touch, pinprick, vibration, temperature sense in the upper and lower extremities.   Gait, station and balance: She stands up from the seated position with no difficulty. No veering to one side is noted. No leaning to one side. Posture is age appropriate stooped. Stance is narrow-based. She turns en bloc. Tandem walk is normal. Balance is not significantly impaired.   Assessment and Plan:   In summary, BECKI MCCASKILL is a very pleasant 73 year old female with a history of memory  loss in keeping with Alzheimer's disease with progression noted in her slowness in processing and her slowness in thinking recently, and 2 point drop in MMSE from 5 months ago. I talked to her and her husband again about her recent test results including her cognitive testing and today's memory scores. She has been able to tolerate Aricept 10 mg daily generic and Namenda XR at 14 mg daily at this time. I would like to increase this to 21 mg daily today. Down the road we will also repeat her cognitive testing for comparison and consider going to 28 mg strength namenda XR. Her physical exam is stable and I reassured the patient and her husband. Her weight loss has stabilized thankfully. She is encouraged to drink more water and continue with good nutrition and protein intake. Her cholesterol worsened after she stopped simvastatin. She is now on Zetia. I encouraged the patient to eat healthy, exercise daily and keep well hydrated, to keep a scheduled bedtime and wake time routine, to not skip any meals and eat healthy snacks in between meals and to have protein with every meal. I stressed the importance of regular exercise, within of course the patient's own mobility limitations. I encouraged the patient to keep up with current events by reading the news paper or watching the news and to do word puzzles, or if feasible, to go on [email or web address].   As far as further diagnostic testing is concerned, I  suggested the following: no new test today. As far as medications are concerned, I recommended the above. I answered all their questions today and they  were in agreement with the above outlined plan. I would like to suggest a three-month followup with our nurse practitioner, Ms. Lam and I will see her back afterwards. I encouraged them to call with any interim questions, concerns, problems, updates and refill requests. I renewed her prescription for donepezil for 90 days with refills and placed a new prescription for Namenda XR 21 mg daily.

## 2013-12-25 ENCOUNTER — Other Ambulatory Visit (HOSPITAL_COMMUNITY): Payer: Self-pay | Admitting: Obstetrics and Gynecology

## 2013-12-25 ENCOUNTER — Encounter: Payer: Self-pay | Admitting: Neurology

## 2013-12-25 DIAGNOSIS — Z1231 Encounter for screening mammogram for malignant neoplasm of breast: Secondary | ICD-10-CM

## 2014-01-14 ENCOUNTER — Ambulatory Visit (HOSPITAL_COMMUNITY)
Admission: RE | Admit: 2014-01-14 | Discharge: 2014-01-14 | Disposition: A | Discharge status: Home / Self Care | Payer: Medicare Other | Source: Ambulatory Visit | Attending: Obstetrics and Gynecology | Admitting: Obstetrics and Gynecology

## 2014-01-14 DIAGNOSIS — Z1231 Encounter for screening mammogram for malignant neoplasm of breast: Secondary | ICD-10-CM | POA: Diagnosis not present

## 2014-02-16 ENCOUNTER — Ambulatory Visit (INDEPENDENT_AMBULATORY_CARE_PROVIDER_SITE_OTHER): Payer: 59 | Admitting: Neurology

## 2014-02-16 ENCOUNTER — Ambulatory Visit: Payer: Medicare Other | Admitting: Nurse Practitioner

## 2014-02-16 ENCOUNTER — Encounter: Payer: Self-pay | Admitting: Neurology

## 2014-02-16 VITALS — BP 140/75 | HR 67 | Temp 97.0°F | Ht 62.5 in | Wt 128.0 lb

## 2014-02-16 DIAGNOSIS — G309 Alzheimer's disease, unspecified: Secondary | ICD-10-CM

## 2014-02-16 DIAGNOSIS — F0280 Dementia in other diseases classified elsewhere without behavioral disturbance: Secondary | ICD-10-CM

## 2014-02-16 NOTE — Progress Notes (Signed)
Subjective:    Patient ID: Hannah Soto is a 74 y.o. female.  HPI     Interim history:   Hannah Soto is a very pleasant 74 year old right-handed lady with an underlying medical history of hyperlipidemia, osteopenia, arthritis, cataracts, nephrolithiasis, status post right wrist surgery and pelvic prolapse surgery, who presents for FU consultation of her memory loss c/w AD. She is accompanied by her husband again today. I last saw her on 11/13/2013, at which time her MMSE was 18 out of 30, clock drawing was 2 out of 4 and animal fluency was 7/m. She was tolerating Namenda XR at 14 mg daily. I increase this to 21 mg daily and also consider repeating neuropsychological testing and down the Road.  Today, she reports, feeling about the same. Her mood is stable. Her appetite is good. She has not driven a car and over a year. She tries to walk regularly but does not exercise on a day-to-day basis. She feels she has a good psychosocial support system. Her husband has noted a mild further decline in her memory and her previously very meticulous cleanliness and well organized household which is less well organized. She feels he takes good care of her. He has had some medical issues in particular with arthritis of his right knee which he needs to get addressed. She has had no recent illness. She had no recent changes in her medications from her primary care physician.  I saw her on 06/17/2013, at which time I suggested we continue with donepezil 10 mg daily and I suggested that she start Namenda long-acting at 7 mg strength. I suggested she have her cholesterol checked with her primary care physician as she had stopped taking her simvastatin in March 2015. She had her cholesterol rechecked in June and it had increased. She was started on Zetia by her primary care physician. In the interim, her husband called back in June reporting that she was able to tolerate the starting dose of Namenda XR. I suggested that we  increase the dose to 14 mg once daily.  They have one son and one daughter and three grandchildren. She has had no recent illness. Her appetite is good and sleeps well. She drinks one glass of wine per night.   I saw her on 12/17/2012, at which time we talked about her test results including her cognitive testing. I asked her to continue on Aricept 10 mg strength. She was tolerating this well but did report some mild intermittent diarrhea, which was tolerable to her.   I saw her on 09/16/2012, at which time I reinforced the importance of taking the Aricept daily. I also increase it from 5 to10 mg daily. We also talked about her recent brain scan results. In addition, I referred her for formal cognitive testing. She met with Dr. Valentina Shaggy on 11/11/2012 and I reviewed his neuropsychological report with them: Neurocognitive testing indicated global cognitive compromise, mostly to a severe degree, no indications of mood disturbance, difficulties with psychosocial adjustment or behavioral problems. Dr. Valentina Shaggy felt that she probably had dementia of the Alzheimer's type. 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 She needed line of sight supervision while cooking. He felt that her ability to safely drive was questionable. He felt that she did not necessarily had sufficient mental capacity to make complex decisions in her own interests and suggested that her husband seek power of attorney for her finances as well as her health (  he already has health care POA). He also suggested that they involve their adult children more and her wife's health situation.   I first met her on 06/11/2012, at which time she reported memory loss over the past several months. Her MMSE in her primary care physician's office on 05/23/2012 was 22/29. and she had trouble with clock drawing. Blood work with her PCP had shown a normal CBC, normal CMP, normal TSH and nonreactive RPR. At the time  of her first visit with me I suggested a brain MRI and MRA, which had on 06/24/12: Moderate-severe mesial temporal and hippocampal atrophy and MRA showed: Right middle cerebral artery M2 branches have decreased signal on 3D reconstuctions. These branches appear normal on axial views. The M3 and distal branches appear normal. Therefore this likely represents artifact. 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 Remainder of medium to large sized vessels appear normal.   Her MMSE was 20/30 on 06/11/12 and I suggested starting her on Aricept 5 mg daily, but she was taking it more on an as-needed basis, indicating that she does took it on more days than not. Her husband indicated that he had not been overseeing her medications regularly. She had no problems tolerating Aricept at the lower dose.   Her Past Medical History Is Significant For: Past Medical History  Diagnosis Date  . Memory loss   . Hyperlipidemia     Her Past Surgical History Is Significant For: Past Surgical History  Procedure Laterality Date  . Doppler echocardiography  Apr 07, 2010    NORMAL ECHO ,EF >55%,MILD IMPAIRED RELAXATION,MILD MR AND TR  . Nm myocar single w/spect  Apr 07 2010    EF 75% WITH NO ISCHEMIA OR INFARCTION  . Cardiac catheterization  10/2008    SHOWED LEFT DOMINANT SYSTEM WITH NO SIGNIFICANT DISEASE    Her Family History Is Significant For: Family History  Problem Relation Age of Onset  . Heart attack Father     Her Social History Is Significant For: History   Social History  . Marital Status: Married    Spouse Name: Hannah Soto    Number of Children: 2  . Years of Education: 14   Occupational History  .      retired   Social History Main Topics  . Smoking status: Never Smoker   . Smokeless tobacco: None  . Alcohol Use: 2.4 oz/week    4 Not specified per week     Comment: one glass of wine daily  . Drug Use: No  . Sexual Activity: None    Other Topics Concern  . None   Social History Narrative   She is a married, mother of 36, grandmother 3. Up until her recent diagnosis with Alzheimer's, she been quite active. She still tries to be she does water aerobics and walks. She does not smoke, and has occasional alcoholic beverages.    Her Allergies Are:  Allergies  Allergen Reactions  . Ciprofloxacin Other (See Comments)    Can't remember.  . Codeine     Can't remember   . Levofloxacin     Can't remember  . Niaspan [Niacin] Other (See Comments)    intolerant  . Sulfa Antibiotics   . Vytorin [Ezetimibe-Simvastatin] Other (See Comments)    myalgias  :   Her Current Medications Are:  Outpatient Encounter Prescriptions as of 02/16/2014  Medication Sig  . aspirin EC 81 MG tablet Take 81 mg by mouth daily.  Marland Kitchen  benzonatate (TESSALON) 100 MG capsule   . BOOSTRIX 5-2.5-18.5 injection   . donepezil (ARICEPT) 10 MG tablet TAKE ONE TABLET AT BEDTIME.  Marland Kitchen ezetimibe (ZETIA) 10 MG tablet Take 1 tablet (10 mg total) by mouth daily.  Marland Kitchen glucosamine-chondroitin 500-400 MG tablet Take 1 tablet by mouth 4 (four) times daily.  Marland Kitchen ibuprofen (ADVIL,MOTRIN) 100 MG chewable tablet Chew 100 mg by mouth every 8 (eight) hours as needed for fever.  . Memantine HCl ER (NAMENDA XR) 14 MG CP24 Take 1 tablet (14 mg total) by mouth daily.  . Memantine HCl ER (NAMENDA XR) 21 MG CP24 Take 21 mg by mouth daily.  . Multiple Vitamins-Minerals (CENTRUM SILVER ULTRA WOMENS) TABS Take 1 tablet by mouth daily.  . ondansetron (ZOFRAN) 4 MG tablet Take 1 tablet by mouth daily.  . simvastatin (ZOCOR) 40 MG tablet Take 40 mg by mouth daily at 6 PM.   :  Review of Systems:  Out of a complete 14 point review of systems, all are reviewed and negative with the exception of these symptoms as listed below:    Review of Systems  All other systems reviewed and are negative.   Objective:  Neurologic Exam  Physical Exam Physical Examination:   Filed Vitals:    02/16/14 1101  BP: 140/75  Pulse: 67  Temp: 97 F (36.1 C)    General Examination: The patient is a very pleasant 74 y.o. female in no acute distress. She is calm and cooperative with the exam. She denies Auditory Hallucinations and Visual Hallucinations.   HEENT: Normocephalic, atraumatic, pupils are equal, round and reactive to light and accommodation. Extraocular tracking shows mild saccadic breakdown without nystagmus noted. Hearing is intact. Funduscopic exam is normal. Face is symmetric with no facial masking and normal facial sensation. There is no lip, neck or jaw tremor. Neck is not rigid with intact passive ROM. There are no carotid bruits on auscultation. Oropharynx exam reveals mild mouth dryness. No significant airway crowding is noted. Mallampati is class II. Tongue protrudes centrally and palate elevates symmetrically.    Chest: is clear to auscultation without wheezing, rhonchi or crackles noted.  Heart: sounds are regular and normal without murmurs, rubs or gallops noted.   Abdomen: is soft, non-tender and non-distended with normal bowel sounds appreciated on auscultation.  Extremities: There is no pitting edema in the distal lower extremities bilaterally. Pedal pulses are intact.  Skin: is warm and dry with no trophic changes noted.  Musculoskeletal: exam reveals no obvious joint deformities, tenderness or joint swelling or erythema.  Neurologically:  Mental status: The patient is awake and alert, paying good  attention. She is able to to partially provide the history. Her husband provides details. She is oriented to: person, situation and day of week, city, county, state, not exact date, but month, not year. Her memory, attention, language and knowledge are impaired. There is no aphasia, agnosia, apraxia or anomia. There is a mild degree of bradyphrenia. Speech is mildly hypophonic with no dysarthria noted. Mood is congruent and affect is normal.   On 06/11/2012: Her MMSE  score was 20/30. CDT was 3/4. AFT (Animal Fluency Test) score was 5.  On 12/17/12: CDT was 2/4, AFT 7/min.  On 06/17/13: MMSE 20/30, AFT 3/min, CDT 2/4. On 11/13/2013: MMSE: 18/30, CDT 2/4, AFT: 7/min  On 02/16/2014: MMSE: 15/30, CDT: 2/4, AFT: 6/min, GDS: 2/15.   Cranial nerves are as described above under HEENT exam. In addition, shoulder shrug is normal with equal shoulder height  noted.  Motor exam: Normal bulk, and strength for age is noted. Tone is not rigid with absence of cogwheeling in the bilateral extremities. There is overall no bradykinesia. There is no drift or rebound. There is no tremor. Romberg is negative. Reflexes are 2+ in the upper extremities and 1+ in the lower extremities. Fine motor skills: Finger taps, hand movements, and rapid alternating patting are not significantly impaired bilaterally. Foot taps and foot agility are not impaired bilaterally.   Cerebellar testing shows no dysmetria or intention tremor on finger to nose testing. There is no truncal or gait ataxia.   Sensory exam is intact to light touch, pinprick, vibration, temperature sense in the upper and lower extremities.   Gait, station and balance: She stands up from the seated position with no difficulty. No veering to one side is noted. No leaning to one side. Posture is age appropriate stooped. Stance is narrow-based. She turns en bloc. Tandem walk is normal. Balance is not significantly impaired.   Assessment and Plan:   In summary, Hannah Soto is a very pleasant 74 year old female with a history of memory loss in keeping with Alzheimer's disease with progression noted. She has been able to tolerate Aricept 10 mg daily generic and Namenda XR at 21 mg daily at this time. I would like to request reevaluation with formal neuropsychological testing in comparison of findings from September 2014. I made a referral to Dr. Valentina Shaggy in that regard. She was in agreement. We will keep her medication the same at this time  even though she did have a drop in her MMSE score and her husband would like to keep things the same at this time. I would like to see her back in about 3 months and also get a report on her cognitive testing. her physical exam is stable. I encouraged the patient to eat healthy, exercise daily and keep well hydrated, to keep a scheduled bedtime and wake time routine, to not skip any meals and eat healthy snacks in between meals and to have protein with every meal. I stressed the importance of regular exercise, within of course the patient's own mobility limitations. I encouraged the patient to keep up with current events by reading the news paper or watching the news and to do word puzzles, or if feasible, to go on [email or web address].   I answered all their questions today and they were in agreement.

## 2014-02-16 NOTE — Patient Instructions (Signed)
We will continue with Namenda XR at 21 mg daily and Aricept generic 10 mg daily.  We will do a referral to Dr. Leonides Cave for a re-evaluation and comparison of findings from 9/14.

## 2014-04-15 ENCOUNTER — Ambulatory Visit: Payer: 59 | Attending: Psychology | Admitting: Psychology

## 2014-04-15 DIAGNOSIS — F0390 Unspecified dementia without behavioral disturbance: Secondary | ICD-10-CM

## 2014-04-15 DIAGNOSIS — F0280 Dementia in other diseases classified elsewhere without behavioral disturbance: Secondary | ICD-10-CM

## 2014-04-15 DIAGNOSIS — G309 Alzheimer's disease, unspecified: Secondary | ICD-10-CM

## 2014-04-15 NOTE — Progress Notes (Addendum)
Initial Contact Note  Name: Hannah Soto MRN: [id number] Date: 04/15/2014  Hannah Soto is an 74 y.o. right handed female who was referred for neuropsychological evaluation by Huston Foley, MD for comparison to results from a prior neuropsychological evaluation from September 2014.   A total of 5 hours was spent today reviewing medical records, interviewing (CPT [phone]) Sandria Bales and administering and scoring neurocognitive tests (CPTs [phone] & [phone]) and preparing a written report.  There were no concerns expressed or behaviors displayed by Sandria Bales that would require immediate attention.   A full report will follow.   Gladstone Pih, Ph.D Licensed Psychologist 04/15/2014  ______________________________________________________________________  Gladstone Pih, Ph.D [facility]  [address]   Telephone [phone] [address] Fax [phone] [place], [place] [address]   NEUROPSYCHOLOGICAL EVALUATION  *CONFIDENTIAL* This report should not be released without the consent of the client  Name:   Hannah Soto Date of Birth:  10-Jun-1940 [facility] MR#:  [id number] Date of Evaluation: 04/15/14          Reason for Referral Hannah Soto is a 74 year old, right-handed  woman with an approximate three year history of progressive cognitive decline. A brain MRI scan on 06/24/12 showed moderate to severe mesial temporal and hippocampal atrophy. Neuropsychological evaluation in September 2014 indicated global cognitive compromise, mostly to a severe degree. There were no indications of mood disturbance, difficulties with psychosocial adjustment or behavioral problems. 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 She was referred by Huston Foley, MD of [facility] for a repeat neuropsychological evaluation to compare to the results from 2014.  Sources of  Information Medical Records from [facility] electronic medical records system were reviewed. Ms. Hannah Soto and her husband, Mr. Hannah Soto, were interviewed.   Background Since she was last evaluated in September 2014, her husband has observed her to display a mild decline in memory and less ability to keep her possessions organized. She has had trouble recalling the date and recent events though  has not seemed confused or demonstrated problems with everyday judgment. She has continued to perform all of her personal care activities independently. She completes house cleaning and laundry tasks without prompting. In addition to performing household chores, she has been spending her time watching television, visiting with friends and occasionally reading. She has been taking her medications twice per day without prompting (though he stated that he plans to increase his oversight on medication administration). He has not observed any changes in her mood or personality. She has typically appeared in good spirits and has maintained her interests in family and friends. She has not displayed wandering, low mood, mood instability, apathy, aggression, paranoia, problems with impulse control, inappropriate social behavior or unsafe behavior.   Ms. Hartfield acknowledged problems with memory. She stated that she understands why she was advised to no longer cook, drive or perform financial tasks. She did not report problems with balance, limb strength, vision, hearing, sleep, swallowing, appetite, taste, smell or speech. She reported an absence of pain, including previously reported right knee pain. She described herself as typically in good spirits. She cited a warm and supportive relationship with her husband and an active social life with friends. She did not report any recent or ongoing stressors in her life. She denied experiencing suicidal or homicidal ideation, hallucinations or delusions.  Review of medical  records and input from her husband  did not indicate any significant changes in her health or medical status since her neuropsychological evaluation in 2014. She was started on Namenda XR by Dr. Frances Furbish in May 2015 and Zetia by her primary care physician in June 2015.  Her past medical history was notable for hyperlipidemia, osteopenia, arthritis, cataracts and nephrolithiasis In addition to Namenda XR and Zetia, her current medications also include aspirin, benzonatate, donepezil and ondansetron.  Hannah Soto lives with her husband of fifty four years. They have two children. 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 She was graduated from high school without history of school-based attentional or learning problems. She has no history of developmental delays, head injury, stroke-like symptoms, exposure to toxic chemicals, substance abuse or psychiatric disorder.   Observations She appeared as an appropriately dressed and groomed woman in no apparent distress. Her gait and posture appeared normal. She appeared alert, focused and calm. She interacted in a pleasant and cooperative manner. Her affect appeared flat though was appropriately reactive. Her speech was  well-articulated and fluent. No word-finding pauses or incorrect word usage were evident. Her comprehension of language seemed normal. Her thought processes were coherent without loose associations, verbal perseverations or flight of ideas. She was a less than reliable informant due to memory loss. For example, she could not state any of her medical conditions or medications. Her thought content was devoid of unusual ideas.   Tests Administered Animal Naming Test [place] Naming Test Clinical Dementia Rating Scale Controlled Oral Word Association Test Geriatric Depression Scale Rey Complex Figure:  copy Trail Making A & B Wechsler Adult Intelligence Scale-IV:   Clinical cytogeneticist, Coding, Digit Span, Matrix Reasoning & Similarities Wechsler Memory Scale-IV Older Adult Battery   Symptom Inventories/Questionnaires The Clinical Dementia Rating Scale is a 5-point scale developed to assess the severity of dementia. A reliable informant rates the person's level of functioning on six domains applicable to dementia: Memory, Orientation, Judgment & Problem Solving, [employer], Home & Hobbies, and Personal Care. Her overall score of 1 as derived from ratings made by her husband was suggestive of mild dementia. She was rated as impaired in all categories except for Personal Care.  On the Geriatric Depression Scale (short form), her score of 2/15 did not indicate depression. Of note, when asked if she has more problems with memory than most, she replied "no". This response suggested that while she readily acknowledged having memory difficulties she might be underestimating the severity.  Test Results Test results were considered to yield a valid representation of her cognitive functioning. She appeared to maintain alertness, persist to task and expend optimal effort. She did not report or display problems with vision, hearing or motor control. She had no problems grasping task instructions. There were no indications of careless, impulsive or perseverative responding. She recalled being evaluated here before but reported no recall of the test materials.  Her test scores were corrected to reflect norms for her age and, whenever possible, her gender and educational level (i.e., 12 years). Her premorbid intellectual potential was estimated to fall within the Average range based on her educational level and her previously measured word reading level.   A side-by-side listing of test results from the current and previous testing is listed below:  SEPTEMBER 2014   MARCH 2016 Animal Naming Test   score= 6 <1st      score= 1  <  1st      [place] Naming Test  score= 26/60   1st       score= 12/60  <1st     Controlled Oral Word   score= 16   2nd      score= 13    1st  Association Test     Rey Complex Figure: copy  unable to complete   Unable to complete     Trails A    Score= 240s  0e <1st    unable to perform   Trails B     unable to perform     unable to perform    Wechsler Adult Intelligence Scale-IV         scaled score    %        scaled score   % Block Design   3     1st             3    1st     Similarities   4      2nd               1    <1st  Digit Span   5      5th              6       9th   Matrix Reasoning   9   37th              7   16th    Coding     3     1st                1     <1st                  Wechsler Memory Scale-IV (Older Adult)      Index          Index Score        %   Index Score          %            Immediate Memory  51   <1st           43      <1st   Auditory Memory   57   <1st            51      <1st   Visual Memory   45   <1st            40     <1st   Delayed Memory   54   <1st            51     <1st   Symbol Span subtest        2nd               5th   Test Interpretation Neuropsychological testing again indicated global cognitive compromise. As shown above, she was unable to draw a complex drawing (Rey Complex Figure), draw lines to connect numbers on a page in sequence (Trails A) or perform a test that required set shifting (Trails B). She scored within the moderate to severe range of impairment (i.e., 1st percentile or below) on tests of visual processing speed/scanning efficiency (WAIS-IV Coding), semantic fluency (Animal Naming), phonemic fluency (Controlled Oral Word Association Test), naming to confrontation [facility] [employer]), immediate and delayed memory (Wechsler Memory Scale-IV-Older Adult), visual-spatial assembly (  WAIS-IV Block Design) and verbal reasoning (WAIS-IV Similarities). Within the mildly impaired range were her performances on tests of  working memory (Levi StraussWAIS-IV Digit Span; WMS-IV Symbol Span). Finally, her best performance was within the Low Average range on a test of nonverbal reasoning (WAIS-IV Matrix Reasoning).  Compared to the prior test administration, she demonstrated lower raw scores on most all tests except for tests of working memory span. Given that she had previously scored at the lower limit on some tests (i.e., "floor effects"), it was difficult to determine to what degree her current scores reflected a decline in that ability.  Summary & Conclusions Celene SkeenBetty Haberl is a 74 year old woman with an approximate three year history of progressive cognitive decline presumed secondary to Alzheimer's disease. She was referred for a repeat neuropsychological evaluation to compare to the results from September 2014.  According to her husband, her overall level of everyday functioning has mildly declined over the past one and one-half years. She does continue to perform all of her personal care activities independently as well as house cleaning and laundry tasks without prompting. She is unable to perform any other instrumental activities of daily living. She continues to socialize and report enjoyment in life. There was no report or indications of mood disturbance, difficulties with psychosocial adjustment or behavioral problems.   Repeat neuropsychological testing again indicated global cognitive compromise, mostly to a moderate to severe degree. Compared to the prior neurocognitive testing of seventeen months ago, she demonstrated lower raw scores on most all tests except on tests of working memory. Given that she had previously scored at the lower limit on some of these tests, it was difficult to determine to what degree her current scores reflected a decline in those abilities.  Her husband impressed as supportive and providing her with appropriate assistance and supervision. Her living arrangement appears optimal at this time.    She currently lacks sufficient mental capacity to make complex decisions in her own interests.   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 Her cognitive compromise has reached a level that family observations supplemented with results from rating scales of her self-care capacities and neurobehavioral functioning will be more clinically useful than further neuropsychological testing.   We have again appreciated the opportunity to evaluate Ms. Montez Moritaarter. The conclusions and recommendations from this evaluation were discussed with Mr. Montez MoritaCarter on 04/16/14. Please feel free to contact me with any comments or questions.    ___________________ Gladstone PihMichael F. Bryn Saline, Ph.D Licensed Psychologist

## 2014-05-14 ENCOUNTER — Telehealth: Payer: Self-pay | Admitting: *Deleted

## 2014-05-14 NOTE — Telephone Encounter (Signed)
Mr. Montez MoritaCarter just wanted to give you an update about Hannah Soto. He states that she is doing a few more forgetful things like putting boiled eggs in the freezer. But reports that overall they are doing well. They have an appt in September and he feels that she does not need to be seen sooner. He will call if any further concerns.

## 2014-05-17 ENCOUNTER — Other Ambulatory Visit: Payer: Self-pay | Admitting: Neurology

## 2014-05-18 ENCOUNTER — Ambulatory Visit: Payer: Medicare Other | Admitting: Neurology

## 2014-05-18 NOTE — Telephone Encounter (Signed)
Has appt in Sept  

## 2014-05-18 NOTE — Telephone Encounter (Signed)
Please offer sooner appointment with nurse practitioner.

## 2014-05-19 NOTE — Telephone Encounter (Signed)
I spoke with Mr. Montez MoritaCarter and he is aware of Dr. Teofilo PodAthar's recommendation but he is hesitant to make an appt. He states that Kathie RhodesBetty gets "depressed" after having memory testing. He would like to think about it and call us back if he wants to make appt.

## 2014-05-29 ENCOUNTER — Telehealth: Payer: Self-pay | Admitting: Cardiology

## 2014-06-01 ENCOUNTER — Telehealth: Payer: Self-pay | Admitting: Cardiology

## 2014-06-01 NOTE — Telephone Encounter (Signed)
Close encounter 

## 2014-06-02 NOTE — Telephone Encounter (Signed)
Close encounter 

## 2014-07-01 ENCOUNTER — Ambulatory Visit (INDEPENDENT_AMBULATORY_CARE_PROVIDER_SITE_OTHER): Payer: Medicare Other | Admitting: Cardiology

## 2014-07-01 ENCOUNTER — Encounter: Payer: Self-pay | Admitting: Cardiology

## 2014-07-01 VITALS — BP 126/70 | HR 56 | Ht 61.0 in | Wt 123.9 lb

## 2014-07-01 DIAGNOSIS — I10 Essential (primary) hypertension: Secondary | ICD-10-CM | POA: Diagnosis not present

## 2014-07-01 DIAGNOSIS — E782 Mixed hyperlipidemia: Secondary | ICD-10-CM

## 2014-07-01 DIAGNOSIS — R413 Other amnesia: Secondary | ICD-10-CM

## 2014-07-01 DIAGNOSIS — Z79899 Other long term (current) drug therapy: Secondary | ICD-10-CM

## 2014-07-01 NOTE — Patient Instructions (Signed)
Please have lipids and liver function test ( hepatic ) when you see your primary Doctor in June 2016  NO CHANGES WITH CURRENT MEDICATIONS.   Your physician wants you to follow-up in 12 MONTHS DR HARDING.  You will receive a reminder letter in the mail two months in advance. If you don't receive a letter, please call our office to schedule the follow-up appointment.

## 2014-07-01 NOTE — Progress Notes (Signed)
PATIENT: Hannah BalesBetty J Soto MRN: [id number]  DOB: 04/27/1940   DOV:07/03/2014 PCP: Hannah AlkenBARNES,Hannah Soto  Clinic Note: Chief Complaint  Patient presents with  . Annual Exam    patient reports no problems since her last visdit.    HPI: Hannah Soto is a 74 y.o.  female with a PMH below who presents today for followup of her hypercholesterolemia and family history of CAD. I last saw her in March of 2015 into doing relatively well. In the interim since her last visit, she has been diagnosed with Alzheimer's Disease, and is quite upset with this. She is having heart, to grips with what it means and what is coming in the future. She is actually concerned of simvastatin may also be due to memory loss. Still having memory problems & lack of executive functioning - no longer cooking or driving.  Interval History: Not as active  - walks some.  Does Take-out more than usual (she does not cook as much). Is relatively asymptomatic from a cardiac standpoint.  Cardiovascular ROS: no chest pain or dyspnea on exertion positive for - Rare palpitations negative for - irregular heartbeat, loss of consciousness, orthopnea, paroxysmal nocturnal dyspnea, rapid heart rate, shortness of breath or TIA/amaurosis fugax, syncopal/near syncope   Past Medical History  Diagnosis Date  . Memory loss   . Hyperlipidemia     Prior Cardiac Evaluation and Past Surgical History: Past Surgical History  Procedure Laterality Date  . Doppler echocardiography  Apr 07, 2010    NORMAL ECHO ,EF >55%,MILD IMPAIRED RELAXATION,MILD MR AND TR  . Nm myocar single w/spect  Apr 07 2010    EF 75% WITH NO ISCHEMIA OR INFARCTION  . Cardiac catheterization  10/2008    SHOWED LEFT DOMINANT SYSTEM WITH NO SIGNIFICANT DISEASE    Allergies  Allergen Reactions  . Ciprofloxacin Other (See Comments)    Can't remember.  . Codeine     Can't remember   . Levofloxacin     Can't remember  . Niaspan [Niacin] Other (See Comments)   intolerant  . Sulfa Antibiotics   . Vytorin [Ezetimibe-Simvastatin] Other (See Comments)    myalgias    Current Outpatient Prescriptions  Medication Sig Dispense Refill  . aspirin EC 81 MG tablet Take 81 mg by mouth daily.    Marland Kitchen. BOOSTRIX 5-2.5-18.5 injection     . donepezil (ARICEPT) 10 MG tablet TAKE ONE TABLET AT BEDTIME. 90 tablet 3  . ezetimibe (ZETIA) 10 MG tablet Take 1 tablet (10 mg total) by mouth daily. 30 tablet 11  . glucosamine-chondroitin 500-400 MG tablet Take 1 tablet by mouth daily.     Marland Kitchen. ibuprofen (ADVIL,MOTRIN) 100 MG chewable tablet Chew 100 mg by mouth every 8 (eight) hours as needed for fever.    . Multiple Vitamins-Minerals (CENTRUM SILVER ULTRA WOMENS) TABS Take 1 tablet by mouth daily.    Marland Kitchen. NAMENDA XR 21 MG CP24 TAKE 1 CAPSULE DAILY. 30 capsule 5  . simvastatin (ZOCOR) 40 MG tablet Take 40 mg by mouth daily at 6 PM.      No current facility-administered medications for this visit.   she reports that she is not taking Aricept  History   Social History Narrative   She is a married, mother of 4, grandmother 3. Up until her recent diagnosis with Alzheimer's, she been quite active. She still tries to be she does water aerobics and walks. She does not smoke, and has occasional alcoholic beverages.   ROS: A comprehensive Review  of Systems -  Was performed  Review of Systems  Cardiovascular: Negative.  Negative for claudication.  Gastrointestinal: Negative for blood in stool and melena.  Genitourinary: Negative for hematuria.  Musculoskeletal: Negative for myalgias and falls.  Neurological: Negative for dizziness.  Psychiatric/Behavioral: Positive for memory loss. Negative for depression. The patient is nervous/anxious.   All other systems reviewed and are negative.  PHYSICAL EXAM BP 126/70 mmHg  Pulse 56  Ht 5\' 1"  (1.549 m)  Wt 56.201 kg (123 lb 14.4 oz)  BMI 23.42 kg/m2 General appearance: alert, cooperative, appears stated age, no distress and Anxious and  almost sad appearing. Somewhat blunted mood and affect. She does answer questions to the best of her ability. Neck: no adenopathy, no carotid bruit, no JVD, supple, symmetrical, trachea midline and thyroid not enlarged, symmetric, no tenderness/mass/nodules Lungs: clear to auscultation bilaterally, normal percussion bilaterally and Nonlabored, good air movement Heart: regular rate and rhythm, S1, S2 normal, no murmur, click, rub or gallop and normal apical impulse Abdomen: soft, non-tender; bowel sounds normal; no masses,  no organomegaly Extremities: extremities normal, atraumatic, no cyanosis or edema and no ulcers, gangrene or trophic changes Pulses: 2+ and symmetric Neurologic: Mental status: Alert, oriented, thought content appropriate, affect: blunted Cranial nerves: normal   Adult ECG Report  Rate: 56 ;  Rhythm: sinus bradycardia and Possible left atrial enlargement. Anterior Q waves/choroid progression suggestion of anterior infarct, age undetermined.  Narrative Interpretation: Lead placement looks slightly different than previous EKG with poor R-wave progression. Otherwise no notable change.  Recent Labs:  Lab Results  Component Value Date   CHOL 227* 07/16/2013   HDL 54 07/16/2013   LDLCALC 149* 07/16/2013   TRIG 118 07/16/2013   CHOLHDL 4.2 07/16/2013    ASSESSMENT / PLAN: Problem List Items Addressed This Visit    Essential hypertension (Chronic)    Well-controlled. Not on any medications at this time.      Relevant Orders   Lipid panel   Hepatic function panel   EKG 12-Lead (Completed)   Memory loss    Clearly showing more signs of progressive dementia. She is not the greatest historians improve much does not answer questions with more than 1 or 2 words.      Mixed hyperlipidemia - Primary (Chronic)    We had stopped her simvastatin during last visit, due to concerns of memory loss. She is on Zetia. She would be due for repeat labs this June. With will give her a  lab slip for LFTs and lipid panel. This would be if she is not having labs checked by her PCP in the near future. Adjust meds accordingly. Would likely not be aggressive - would try to target may be LDL less than 130       Relevant Orders   Lipid panel   Hepatic function panel   EKG 12-Lead (Completed)    Other Visit Diagnoses    Encounter for long-term (current) use of medications        Relevant Orders    Lipid panel    Hepatic function panel    EKG 12-Lead (Completed)         Orders Placed This Encounter  Procedures  . Lipid panel    Order Specific Question:  Has the patient fasted?    Answer:  Yes  . Hepatic function panel  . EKG 12-Lead   No orders of the defined types were placed in this encounter.    Followup: One year  Tacora Athanas W.  Herbie Baltimore, M.D., M.S. Interventional Cardiology [facility]

## 2014-07-03 NOTE — Assessment & Plan Note (Signed)
Well-controlled. Not on any medications at this time.

## 2014-07-03 NOTE — Assessment & Plan Note (Addendum)
We had stopped her simvastatin during last visit, due to concerns of memory loss. She is on Zetia. She would be due for repeat labs this June. With will give her a lab slip for LFTs and lipid panel. This would be if she is not having labs checked by her PCP in the near future. Adjust meds accordingly. Would likely not be aggressive - would try to target may be LDL less than 130

## 2014-07-03 NOTE — Assessment & Plan Note (Signed)
Clearly showing more signs of progressive dementia. She is not the greatest historians improve much does not answer questions with more than 1 or 2 words.

## 2014-08-03 ENCOUNTER — Other Ambulatory Visit: Payer: Self-pay | Admitting: Cardiology

## 2014-08-03 NOTE — Telephone Encounter (Signed)
E-sent to pharmacy . Needs appointment 

## 2014-10-21 ENCOUNTER — Encounter: Payer: Self-pay | Admitting: Neurology

## 2014-10-21 ENCOUNTER — Telehealth: Payer: Self-pay | Admitting: Neurology

## 2014-10-21 ENCOUNTER — Ambulatory Visit (INDEPENDENT_AMBULATORY_CARE_PROVIDER_SITE_OTHER): Payer: Medicare Other | Admitting: Neurology

## 2014-10-21 VITALS — BP 108/71 | HR 78 | Resp 14 | Ht 62.0 in | Wt 123.0 lb

## 2014-10-21 DIAGNOSIS — G309 Alzheimer's disease, unspecified: Secondary | ICD-10-CM

## 2014-10-21 DIAGNOSIS — F0280 Dementia in other diseases classified elsewhere without behavioral disturbance: Secondary | ICD-10-CM

## 2014-10-21 MED ORDER — DONEPEZIL HCL 10 MG PO TABS: ORAL_TABLET | ORAL | Status: DC

## 2014-10-21 MED ORDER — MEMANTINE HCL ER 28 MG PO CP24: 28.0000 mg | ORAL_CAPSULE | Freq: Every day | ORAL | Status: DC

## 2014-10-21 NOTE — Telephone Encounter (Signed)
Left message to call back, I have a few questions about what kind of care Hannah Soto needs.

## 2014-10-21 NOTE — Telephone Encounter (Signed)
Left message to call back  

## 2014-10-21 NOTE — Telephone Encounter (Signed)
Daughter Daryel Gerald [phone] called to inquire about Home Health Agencies that we would recommend for patient. Would like to know if [facility] is one that we would recommend or is there another agency we would recommend.

## 2014-10-21 NOTE — Patient Instructions (Signed)
Take your donepezil once daily in the morning. We will increase your Namenda XR to 28 mg daily.  Try to drink more water, exercise daily.

## 2014-10-21 NOTE — Telephone Encounter (Signed)
Daughter Daryel Gerald is returning your call [phone]

## 2014-10-21 NOTE — Progress Notes (Signed)
Subjective:    Patient ID: Hannah Soto is a 74 y.o. female.  HPI     Interim history:   Hannah Soto is a very pleasant 74 year old right-handed lady with an underlying medical history of hyperlipidemia, osteopenia, arthritis, cataracts, nephrolithiasis, status post right wrist surgery and pelvic prolapse surgery, who presents for FU consultation of her memory loss c/w AD. She is accompanied by her husband again today. Of note, an appointment for 05/18/2014 was canceled by the patient or her husband. I last saw her on 02/16/2014, at which time she reported feeling about the same. She felt her mood was stable. Her appetite was good. She had not been driving a car in over a year. She was trying to walk regularly. She was not necessarily exercising on a day-to-day basis. Her husband had noted a mild further decline in her memory and her previously very meticulous cleanliness and well organized household was less well organized. I suggested she continue with generic Aricept 10 mg once daily and Namenda long-acting 21 mg once daily. Her MMSE was 15 at the time. I suggested repeat neuropsychological evaluation for comparison. She had had no recent changes in her medications from her primary care physician. She had repeat neuropsychological testing with Dr. Valentina Shaggy on 04/15/2014 and a reviewed his report in full: Results were also compared from previous results from September 2014. She had global cognitive compromise, mostly to moderate to severe degree, compared to the prior testing 17 months prior, she demonstrated lower raw scores on most all tests except on tests of working memory. 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  Today, 10/20/2014: She is able to provide very little of her own history. Her husband supplies almost all of her history. She reports feeling fairly well. She does not miss early drink enough water. She sometimes likes to drink ginger  ale. She does not exercise regularly. Lately she has had some confusion even inside the house as to which room she sleeps in and rarely has she had confusion as to her husband's. She has not had any recent illness or medication changes. She seems to tolerate long-acting Namenda at 21 mg daily. She sometimes does not get her donepezil in the morning as her husband works part-time in the morning and she may have forgotten the morning dose of her medications. The Namenda she takes at night and he helps her remember it.    Previously:  I saw her on 11/13/2013, at which time her MMSE was 18 out of 30, clock drawing was 2 out of 4 and animal fluency was 7/m. She was tolerating Namenda XR at 14 mg daily. I increase this to 21 mg daily and also consider repeating neuropsychological testing and down the Road.  I saw her on 06/17/2013, at which time I suggested we continue with donepezil 10 mg daily and I suggested that she start Namenda long-acting at 7 mg strength. I suggested she have her cholesterol checked with her primary care physician as she had stopped taking her simvastatin in March 2015. She had her cholesterol rechecked in June and it had increased. She was started on Zetia by her primary care physician. In the interim, her husband called back in June reporting that she was able to tolerate the starting dose of Namenda XR. I suggested that we increase the dose to 14 mg once daily.  They have one son and one daughter and three grandchildren. She has had no recent illness. Her appetite  is good and sleeps well. She drinks one glass of wine per night.   I saw her on 12/17/2012, at which time we talked about her test results including her cognitive testing. I asked her to continue on Aricept 10 mg strength. She was tolerating this well but did report some mild intermittent diarrhea, which was tolerable to her.   I saw her on 09/16/2012, at which time I reinforced the importance of taking the Aricept daily.  I also increase it from 5 to10 mg daily. We also talked about her recent brain scan results. In addition, I referred her for formal cognitive testing. She met with Dr. Valentina Shaggy on 11/11/2012 and I reviewed his neuropsychological report with them: Neurocognitive testing indicated global cognitive compromise, mostly to a severe degree, no indications of mood disturbance, difficulties with psychosocial adjustment or behavioral problems. Dr. Valentina Shaggy felt that she probably had dementia of the Alzheimer's type. 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 She needed line of sight supervision while cooking. He felt that her ability to safely drive was questionable. He felt that she did not necessarily had sufficient mental capacity to make complex decisions in her own interests and suggested that her husband seek power of attorney for her finances as well as her health (he already has health care POA). He also suggested that they involve their adult children more and her wife's health situation.   I first met her on 06/11/2012, at which time she reported memory loss over the past several months. Her MMSE in her primary care physician's office on 05/23/2012 was 22/29. and she had trouble with clock drawing. Blood work with her PCP had shown a normal CBC, normal CMP, normal TSH and nonreactive RPR. At the time of her first visit with me I suggested a brain MRI and MRA, which had on 06/24/12: Moderate-severe mesial temporal and hippocampal atrophy and MRA showed: Right middle cerebral artery M2 branches have decreased signal on 3D reconstuctions. These branches appear normal on axial views. The M3 and distal branches appear normal. Therefore this likely represents artifact. 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 Remainder of medium to large sized vessels appear normal.   Her MMSE was 20/30 on 06/11/12 and I suggested  starting her on Aricept 5 mg daily, but she was taking it more on an as-needed basis, indicating that she does took it on more days than not. Her husband indicated that he had not been overseeing her medications regularly. She had no problems tolerating Aricept at the lower dose.    Her Past Medical History Is Significant For: Past Medical History  Diagnosis Date  . Memory loss   . Hyperlipidemia     Her Past Surgical History Is Significant For: Past Surgical History  Procedure Laterality Date  . Doppler echocardiography  Apr 07, 2010    NORMAL ECHO ,EF >55%,MILD IMPAIRED RELAXATION,MILD MR AND TR  . Nm myocar single w/spect  Apr 07 2010    EF 75% WITH NO ISCHEMIA OR INFARCTION  . Cardiac catheterization  10/2008    SHOWED LEFT DOMINANT SYSTEM WITH NO SIGNIFICANT DISEASE    Her Family History Is Significant For: Family History  Problem Relation Age of Onset  . Heart attack Father     Her Social History Is Significant For: Social History   Social History  . Marital Status: Married    Spouse Name: Pruette  . Number  of Children: 2  . Years of Education: 14   Occupational History  .      retired   Social History Main Topics  . Smoking status: Never Smoker   . Smokeless tobacco: None  . Alcohol Use: 2.4 oz/week    4 Standard drinks or equivalent per week     Comment: one glass of wine daily  . Drug Use: No  . Sexual Activity: Not Asked   Other Topics Concern  . None   Social History Narrative   She is a married, mother of 63, grandmother 3. Up until her recent diagnosis with Alzheimer's, she been quite active. She still tries to be she does water aerobics and walks. She does not smoke, and has occasional alcoholic beverages.    Her Allergies Are:  Allergies  Allergen Reactions  . Ciprofloxacin Other (See Comments)    Can't remember.  . Codeine     Can't remember   . Levofloxacin     Can't remember  . Niaspan [Niacin] Other (See Comments)    intolerant  .  Sulfa Antibiotics   . Vytorin [Ezetimibe-Simvastatin] Other (See Comments)    myalgias  :   Her Current Medications Are:  Outpatient Encounter Prescriptions as of 10/21/2014  Medication Sig  . aspirin EC 81 MG tablet Take 81 mg by mouth daily.  Marland Kitchen BOOSTRIX 5-2.5-18.5 injection   . DiphenhydrAMINE HCl, Sleep, (ZZZQUIL PO) Take by mouth.  . donepezil (ARICEPT) 10 MG tablet TAKE ONE TABLET AT BEDTIME.  Marland Kitchen ezetimibe (ZETIA) 10 MG tablet Take 1 tablet (10 mg total) by mouth daily. "needs appointment"  . glucosamine-chondroitin 500-400 MG tablet Take 1 tablet by mouth daily.   Marland Kitchen ibuprofen (ADVIL,MOTRIN) 100 MG chewable tablet Chew 100 mg by mouth every 8 (eight) hours as needed for fever.  . Multiple Vitamins-Minerals (CENTRUM SILVER ULTRA WOMENS) TABS Take 1 tablet by mouth daily.  Marland Kitchen NAMENDA XR 21 MG CP24 TAKE 1 CAPSULE DAILY.  Marland Kitchen OVER THE COUNTER MEDICATION   . simvastatin (ZOCOR) 40 MG tablet Take 40 mg by mouth daily at 6 PM.    No facility-administered encounter medications on file as of 10/21/2014.  :  Review of Systems:  Out of a complete 14 point review of systems, all are reviewed and negative with the exception of these symptoms as listed below:   Review of Systems  Neurological:       Husband reports that it seems that patient has more memory loss, "things seem disorganized".     Objective:  Neurologic Exam  Physical Exam Physical Examination:   Filed Vitals:   10/21/14 1122  BP: 108/71  Pulse: 78  Resp: 14    General Examination: The patient is a very pleasant 74 y.o. female in no acute distress. She is calm and cooperative with the exam. She denies Auditory Hallucinations and Visual Hallucinations.   HEENT: Normocephalic, atraumatic, pupils are equal, round and reactive to light and accommodation. Extraocular tracking shows mild saccadic breakdown without nystagmus noted. Hearing is intact. Funduscopic exam is normal. Face is symmetric with minimal facial masking and  normal facial sensation. There is no lip, neck or jaw tremor. Neck is not rigid with intact passive ROM. There are no carotid bruits on auscultation. Oropharynx exam reveals mild mouth dryness. No significant airway crowding is noted. Mallampati is class II. Tongue protrudes centrally and palate elevates symmetrically.    Chest: is clear to auscultation without wheezing, rhonchi or crackles noted.  Heart: sounds are regular  and normal without murmurs, rubs or gallops noted.   Abdomen: is soft, non-tender and non-distended with normal bowel sounds appreciated on auscultation.  Extremities: There is no pitting edema in the distal lower extremities bilaterally. Pedal pulses are intact.  Skin: is warm and dry with no trophic changes noted.  Musculoskeletal: exam reveals no obvious joint deformities, tenderness or joint swelling or erythema.  Neurologically:  Mental status: The patient is awake and alert, paying good  attention. She is able to to partially provide the history. Her husband provides details. She is oriented to: person, situation and day of week, city, county, state, not exact date, but month, not year. Her memory, attention, language and knowledge are impaired. There is no aphasia, agnosia, apraxia or anomia. There is a mild degree of bradyphrenia. Speech is mildly hypophonic with no dysarthria noted. Mood is congruent and affect is normal.   On 06/11/2012: Her MMSE score was 20/30. CDT was 3/4. AFT (Animal Fluency Test) score was 5.  On 12/17/12: CDT was 2/4, AFT 7/min.  On 06/17/13: MMSE 20/30, AFT 3/min, CDT 2/4. On 11/13/2013: MMSE: 18/30, CDT 2/4, AFT: 7/min  On 02/16/2014: MMSE: 15/30, CDT: 2/4, AFT: 6/min, GDS: 2/15.  On 10/21/2014: MMSE: 14/30, CDT: 1/4, AFT: 2/min, GDS: 4/15.   Cranial nerves are as described above under HEENT exam. In addition, shoulder shrug is normal with equal shoulder height noted.  Motor exam: Normal bulk, and strength for age is noted. Tone is not rigid  with absence of cogwheeling in the bilateral extremities. There is overall no bradykinesia. There is no drift or rebound. There is no tremor. Romberg is negative except for slight sway. Reflexes are 2+ in the upper extremities and 1+ in the lower extremities. Fine motor skills: Finger taps, hand movements, and rapid alternating patting are not significantly impaired bilaterally. Foot taps and foot agility are not impaired bilaterally.   Cerebellar testing shows no dysmetria or intention tremor on finger to nose testing. There is no truncal or gait ataxia.   Sensory exam is intact to light touch and temperature in the upper and lower extremities.   Gait, station and balance: She stands up from the seated position with no difficulty. No veering to one side is noted. No leaning to one side. Posture is age appropriate stooped. Stance is narrow-based. She turns en bloc. Tandem walk is normal. Balance is not significantly impaired.   Assessment and Plan:   In summary, Hannah Soto is a very pleasant 74 year old female with a history of memory loss in keeping with Alzheimer's disease with progression noted. She has had no behavioral disturbance. She has been able to tolerate Aricept 10 mg daily generic and Namenda XR at 21 mg daily at this time. I requested repeat neuropsychological testing which she had in March of this year. I talked to them about test results. Test results were in keeping with dementia, most likely of the Alzheimer's type with progression noted as compared to September 2014. I would like to increase her Namenda long-acting to 28 mg once daily and keep her Aricept the same. I've encouraged her to be more physically active. I've encouraged her to drink more water. I would like to see her back in about 6 months, sooner if the need arises. Her physical exam has remained stable. Over the course of the last 2 years she has lost about 20 pounds but lately her weight has been stable. I encouraged  the patient to eat healthy, exercise daily and  keep well hydrated, to keep a scheduled bedtime and wake time routine, to not skip any meals and eat healthy snacks in between meals and to have protein with every meal. I stressed the importance of regular exercise, within of course the patient's own mobility limitations. She is not going to be safe to walk outside by herself as she can get confused and disoriented. She is encouraged to take walks only with her husband. I answered all their questions today and they were in agreement.  I spent 20 minutes in total face-to-face time with the patient, more than 50% of which was spent in counseling and coordination of care, reviewing test results, reviewing medication and discussing or reviewing the diagnosis of dementia, its prognosis and treatment options.

## 2014-10-22 NOTE — Telephone Encounter (Signed)
Email address is: [email or web address]

## 2014-10-22 NOTE — Telephone Encounter (Signed)
Daughter Hannah Soto is returning your call.

## 2014-10-22 NOTE — Telephone Encounter (Signed)
I spoke to [place]. She states that her mother needs help showering, getting dressed, laundry and light cleaning. She has called [facility]. I told her that I would email her with any further agencies that I can come up with.

## 2014-10-26 NOTE — Telephone Encounter (Signed)
I got in touch with our contact, Corrie Dandy, from [facility]. They are unable to help the patient but have other contacts that may be able to help. I have emailed Larita Fife to ask her if I can get her in touch with [facility].

## 2014-10-26 NOTE — Telephone Encounter (Signed)
Tamala Bari with [facility] is going to contact [place]. Larita Fife was very Adult nurse to talk with Corrie Dandy.

## 2014-11-30 ENCOUNTER — Other Ambulatory Visit: Payer: Self-pay | Admitting: Cardiology

## 2014-12-09 NOTE — Telephone Encounter (Signed)
Error

## 2015-03-19 ENCOUNTER — Other Ambulatory Visit: Payer: Self-pay

## 2015-03-19 DIAGNOSIS — Z1231 Encounter for screening mammogram for malignant neoplasm of breast: Secondary | ICD-10-CM

## 2015-04-05 ENCOUNTER — Ambulatory Visit
Admission: RE | Admit: 2015-04-05 | Discharge: 2015-04-05 | Disposition: A | Discharge status: Home / Self Care | Payer: Medicare Other | Source: Ambulatory Visit

## 2015-04-05 DIAGNOSIS — Z1231 Encounter for screening mammogram for malignant neoplasm of breast: Secondary | ICD-10-CM

## 2015-04-21 ENCOUNTER — Ambulatory Visit (INDEPENDENT_AMBULATORY_CARE_PROVIDER_SITE_OTHER): Payer: Medicare Other | Admitting: Neurology

## 2015-04-21 ENCOUNTER — Encounter: Payer: Self-pay | Admitting: Neurology

## 2015-04-21 VITALS — BP 108/58 | HR 68 | Resp 14 | Ht 62.0 in | Wt 134.0 lb

## 2015-04-21 DIAGNOSIS — G301 Alzheimer's disease with late onset: Secondary | ICD-10-CM

## 2015-04-21 DIAGNOSIS — F0280 Dementia in other diseases classified elsewhere without behavioral disturbance: Secondary | ICD-10-CM | POA: Diagnosis not present

## 2015-04-21 MED ORDER — DONEPEZIL HCL 10 MG PO TABS: ORAL_TABLET | ORAL | Status: DC

## 2015-04-21 MED ORDER — MEMANTINE HCL ER 28 MG PO CP24: 28.0000 mg | ORAL_CAPSULE | Freq: Every day | ORAL | Status: DC

## 2015-04-21 NOTE — Patient Instructions (Signed)
Your weight has improved.  Your memory scores have taken a decline. Let's get you back on Namenda XR 28 mg. Let's continue also with the donepezil 10 mg once daily in the evening.

## 2015-04-21 NOTE — Progress Notes (Signed)
Subjective:    Patient ID: Hannah Soto is a 75 y.o. female.  HPI     Interim history:   Hannah Soto is a very pleasant 75 year old right-handed lady with an underlying medical history of hyperlipidemia, osteopenia, arthritis, cataracts, nephrolithiasis, status post right wrist surgery and pelvic prolapse surgery, who presents for FU consultation of her memory loss c/w AD. She is accompanied by her husband and daughter today. I saw her on 10/21/2014, at which time he felt she was stable. She was on long-acting Namenda 21 mg daily and unfortunately sometimes she would forget to take her donepezil in the mornings. Her husband was working part-time in the morning. Her MMSE was 14 out of 30 at the time. I suggested she take the Aricept today regarding night, in particular also to be able to remember it to take it and we increased him in the long-acting to 28 mg once daily.  Today, 04/21/2015: She is not able to provide her own history. Her husband and daughter provide her History. Her daughter is visiting from [place]. Their son lives in [place]. Unfortunately, she has been off of her Namenda long-acting for the past 1 or 2 weeks. She was not always taking her medication regularly. Her husband is motivated to make sure she takes both feet donepezil and Namenda regularly. He was somewhat reluctant to restart it on his own and wanted to talk to me first. She has been able to stabilize her weight. Her nadir was 123 pounds last time I saw her 6 months ago. She was in the mid 140s with her weight when I first met her some 3 years ago. She does not always drink enough water. She does not drink that much ginger ale but likes to drink tea. She has not fallen recently. She likes to walk.   Previously:  Of note, an appointment for 05/18/2014 was canceled by the patient or her husband. I saw her on 02/16/2014, at which time she reported feeling about the same. She felt her mood was stable. Her appetite was  good. She had not been driving a car in over a year. She was trying to walk regularly. She was not necessarily exercising on a day-to-day basis. Her husband had noted a mild further decline in her memory and her previously very meticulous cleanliness and well organized household was less well organized. I suggested she continue with generic Aricept 10 mg once daily and Namenda long-acting 21 mg once daily. Her MMSE was 15 at the time. I suggested repeat neuropsychological evaluation for comparison. She had had no recent changes in her medications from her primary care physician. She had repeat neuropsychological testing with Dr. Valentina Shaggy on 04/15/2014 and a reviewed his report in full: Results were also compared from previous results from September 2014. She had global cognitive compromise, mostly to moderate to severe degree, compared to the prior testing 17 months prior, she demonstrated lower raw scores on most all tests except on tests of working memory. 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  I saw her on 11/13/2013, at which time her MMSE was 18 out of 30, clock drawing was 2 out of 4 and animal fluency was 7/m. She was tolerating Namenda XR at 14 mg daily. I increase this to 21 mg daily and also consider repeating neuropsychological testing and down the Road.  I saw her on 06/17/2013, at which time I suggested we continue with donepezil 10 mg daily and I suggested that  she start Namenda long-acting at 7 mg strength. I suggested she have her cholesterol checked with her primary care physician as she had stopped taking her simvastatin in March 2015. She had her cholesterol rechecked in June and it had increased. She was started on Zetia by her primary care physician. In the interim, her husband called back in June reporting that she was able to tolerate the starting dose of Namenda XR. I suggested that we increase the dose to 14 mg once daily.  They have  one son and one daughter and three grandchildren. She has had no recent illness. Her appetite is good and sleeps well. She drinks one glass of wine per night.   I saw her on 12/17/2012, at which time we talked about her test results including her cognitive testing. I asked her to continue on Aricept 10 mg strength. She was tolerating this well but did report some mild intermittent diarrhea, which was tolerable to her.   I saw her on 09/16/2012, at which time I reinforced the importance of taking the Aricept daily. I also increase it from 5 to10 mg daily. We also talked about her recent brain scan results. In addition, I referred her for formal cognitive testing. She met with Dr. Valentina Shaggy on 11/11/2012 and I reviewed his neuropsychological report with them: Neurocognitive testing indicated global cognitive compromise, mostly to a severe degree, no indications of mood disturbance, difficulties with psychosocial adjustment or behavioral problems. Dr. Valentina Shaggy felt that she probably had dementia of the Alzheimer's type. 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 She needed line of sight supervision while cooking. He felt that her ability to safely drive was questionable. He felt that she did not necessarily had sufficient mental capacity to make complex decisions in her own interests and suggested that her husband seek power of attorney for her finances as well as her health (he already has health care POA). He also suggested that they involve their adult children more and her wife's health situation.    I first met her on 06/11/2012, at which time she reported memory loss over the past several months. Her MMSE in her primary care physician's office on 05/23/2012 was 22/29. and she had trouble with clock drawing. Blood work with her PCP had shown a normal CBC, normal CMP, normal TSH and nonreactive RPR. At the time of her first visit with me I suggested a brain  MRI and MRA, which had on 06/24/12: Moderate-severe mesial temporal and hippocampal atrophy and MRA showed: Right middle cerebral artery M2 branches have decreased signal on 3D reconstuctions. These branches appear normal on axial views. The M3 and distal branches appear normal. Therefore this likely represents artifact. 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 Remainder of medium to large sized vessels appear normal.   Her MMSE was 20/30 on 06/11/12 and I suggested starting her on Aricept 5 mg daily, but she was taking it more on an as-needed basis, indicating that she does took it on more days than not. Her husband indicated that he had not been overseeing her medications regularly. She had no problems tolerating Aricept at the lower dose.    Her Past Medical History Is Significant For: Past Medical History  Diagnosis Date  . Memory loss   . Hyperlipidemia     Her Past Surgical History Is Significant For: Past Surgical History  Procedure Laterality Date  . Doppler echocardiography  Apr 07, 2010    NORMAL ECHO ,EF >55%,MILD IMPAIRED RELAXATION,MILD MR AND TR  . Nm myocar single w/spect  Apr 07 2010    EF 75% WITH NO ISCHEMIA OR INFARCTION  . Cardiac catheterization  10/2008    SHOWED LEFT DOMINANT SYSTEM WITH NO SIGNIFICANT DISEASE    Her Family History Is Significant For: Family History  Problem Relation Age of Onset  . Heart attack Father     Her Social History Is Significant For: Social History   Social History  . Marital Status: Married    Spouse Name: Pruette  . Number of Children: 2  . Years of Education: 14   Occupational History  .      retired   Social History Main Topics  . Smoking status: Never Smoker   . Smokeless tobacco: None  . Alcohol Use: 2.4 oz/week    4 Standard drinks or equivalent per week     Comment: one glass of wine daily  . Drug Use: No  . Sexual Activity: Not Asked   Other Topics  Concern  . None   Social History Narrative   She is a married, mother of 4, grandmother 3. Up until her recent diagnosis with Alzheimer's, she been quite active. She still tries to be she does water aerobics and walks. She does not smoke, and has occasional alcoholic beverages.    Her Allergies Are:  Allergies  Allergen Reactions  . Ciprofloxacin Other (See Comments)    Can't remember.  . Codeine     Can't remember   . Levofloxacin     Can't remember  . Niaspan [Niacin] Other (See Comments)    intolerant  . Sulfa Antibiotics   . Vytorin [Ezetimibe-Simvastatin] Other (See Comments)    myalgias  :   Her Current Medications Are:  Outpatient Encounter Prescriptions as of 04/21/2015  Medication Sig  . aspirin EC 81 MG tablet Take 81 mg by mouth daily.  Marland Kitchen BOOSTRIX 5-2.5-18.5 injection   . DiphenhydrAMINE HCl, Sleep, (ZZZQUIL PO) Take by mouth.  . donepezil (ARICEPT) 10 MG tablet TAKE ONE TABLET AT BEDTIME.  Marland Kitchen glucosamine-chondroitin 500-400 MG tablet Take 1 tablet by mouth daily.   Marland Kitchen ibuprofen (ADVIL,MOTRIN) 100 MG chewable tablet Chew 100 mg by mouth every 8 (eight) hours as needed for fever.  . Multiple Vitamins-Minerals (CENTRUM SILVER ULTRA WOMENS) TABS Take 1 tablet by mouth daily.  Marland Kitchen OVER THE COUNTER MEDICATION   . simvastatin (ZOCOR) 40 MG tablet Take 40 mg by mouth daily at 6 PM.   . ZETIA 10 MG tablet TAKE 1 TABLET EACH DAY.  . [DISCONTINUED] donepezil (ARICEPT) 10 MG tablet TAKE ONE TABLET AT BEDTIME.  . memantine (NAMENDA XR) 28 MG CP24 24 hr capsule Take 1 capsule (28 mg total) by mouth daily.  . [DISCONTINUED] memantine (NAMENDA XR) 28 MG CP24 24 hr capsule Take 1 capsule (28 mg total) by mouth daily. (Patient not taking: Reported on 04/21/2015)   No facility-administered encounter medications on file as of 04/21/2015.  :  Review of Systems:  Out of a complete 14 point review of systems, all are reviewed and negative with the exception of these symptoms as listed  below:   Review of Systems  Neurological:       Husband feels like patient's status has declined. Daughter would like to know expectations for the patient. Husband states that patient has not taken Namenda in a couple of weeks. Patient missed a couple of doses and he  was afraid to start her back.     Objective:  Neurologic Exam  Physical Exam Physical Examination:   Filed Vitals:   04/21/15 0921  BP: 108/58  Pulse: 68  Resp: 14    General Examination: The patient is a very pleasant 75 y.o. female in no acute distress. She is calm and cooperative with the exam. She Is unable to provide her history. She is minimally verbal and answers in one or 2 word sentences only. She is very well dressed and well kempt.  HEENT: Normocephalic, atraumatic, pupils are equal, round and reactive to light and accommodation. Extraocular tracking shows  significant saccadic breakdown and she is having difficulty tracking today.  she has problems following verbal commands but is able to mimic. Funduscopic exam is normal. Face is symmetric with mild facial masking and normal facial sensation. There is no lip, neck or jaw tremor. Neck is not rigid with intact passive ROM. There are no carotid bruits on auscultation. Oropharynx exam reveals mild mouth dryness. No significant airway crowding is noted. Mallampati is class II. Tongue protrudes centrally and palate elevates symmetrically.    Chest: is clear to auscultation without wheezing, rhonchi or crackles noted.  Heart: sounds are regular and normal without murmurs, rubs or gallops noted.   Abdomen: is soft, non-tender and non-distended with normal bowel sounds appreciated on auscultation.  Extremities: There is no pitting edema in the distal lower extremities bilaterally. Pedal pulses are intact.  Skin: is warm and dry with no trophic changes noted.  Musculoskeletal: exam reveals no obvious joint deformities, tenderness or joint swelling or  erythema.  Neurologically:  Mental status: The patient is awake and alert, paying good  attention. She is able to to partially provide the history. Her husband provides details. She is oriented to: person, situation and day of week, city, county, state, not exact date, but month, not year. Her memory, attention, language and knowledge are impaired. There is no aphasia, agnosia, apraxia or anomia. There is a mild degree of bradyphrenia. Speech is mildly hypophonic with no dysarthria noted. Mood is congruent and affect is normal.   On 06/11/2012: Her MMSE score was 20/30. CDT was 3/4. AFT (Animal Fluency Test) score was 5.  On 12/17/12: CDT was 2/4, AFT 7/min.  On 06/17/13: MMSE 20/30, AFT 3/min, CDT 2/4. On 11/13/2013: MMSE: 18/30, CDT 2/4, AFT: 7/min  On 02/16/2014: MMSE: 15/30, CDT: 2/4, AFT: 6/min, GDS: 2/15.  On 10/21/2014: MMSE: 14/30, CDT: 1/4, AFT: 2/min, GDS: 4/15.  On 04/21/2015: MMSE: 5/30, CDT: 1/4, AFT: 0/min.   Cranial nerves are as described above under HEENT exam. In addition, shoulder shrug is normal with equal shoulder height noted.  Motor exam: Normal bulk, and strength for age is noted. Tone is not rigid with absence of cogwheeling in the bilateral extremities. There is overall no bradykinesia. There is no drift or rebound. There is no tremor. Romberg is negative except for slight sway. Reflexes are 1+ in the upper extremities and 1+ in the lower extremities. Fine motor skills: Finger taps, hand movements, and rapid alternating patting are  mildly impaired bilaterally. Foot taps and foot agility are overall mildly impaired bilaterally.   Cerebellar testing shows no dysmetria or intention tremor on finger to nose testing. There is no truncal or gait ataxia.   Sensory exam is intact to light touch, temperature and vibration sense in the upper and lower extremities.   Gait, station and balance: She stands up from the seated position with no difficulty.  No veering to one side is noted. No  leaning to one side. Posture is age appropriate stooped. Stance is narrow-based. She walks more slowly and cautiously today.   Assessment and Plan:   In summary, Hannah Soto is a very pleasant 75 year old female with a history of Dementia with memory loss starting in early 2014 or late 2013. She has been on donepezil. She has had mild progression with time in the past 3 years that I have been seeing her but a steep decline in her memory scores is noted today. She has been off of Namenda long-acting. We increased it last time 6 months ago to 28 mg which is the maximum dose. She has been able to tolerated and I would like for her to be on dual medication. She was able to tolerate both Namenda and Aricept together. I had a long discussion with the patient and particularly her daughter whom I met for the first time today with respect to her dementia which is most likely Alzheimer's type. There is no evidence of behavioral disturbance. She has no significant mood disorder, no recent fall and physical exam is overall stable, weight has improved and stabilized.  We talked about the nature of dementia and its progression and other supportive care including good nutrition, good hydration, physical activity which are important as well. She has a good support system. She is well taken care of. Her husband will take over all her medications and make sure she takes her donepezil and long-acting Namenda. I renewed the prescriptions. I would like to see her back in 3 months, sooner if needed.  I encouraged them to make sure that she eats healthy, exercises daily and keeps well hydrated, keeps a scheduled bedtime and wake time routine, eats healthy snacks in between meals. I stressed the importance of regular exercise, within of course the patient's own mobility limitations. She is not going to be safe to walk outside by herself as she can get confused and disoriented.  I answered all their questions today and they were  in agreement.  I spent 30 minutes in total face-to-face time with the patient, more than 50% of which was spent in counseling and coordination of care, reviewing test results, reviewing medication and discussing or reviewing the diagnosis of dementia, its prognosis and treatment options.

## 2015-08-02 ENCOUNTER — Ambulatory Visit: Payer: Medicare Other | Admitting: Neurology

## 2015-08-04 ENCOUNTER — Other Ambulatory Visit: Payer: Self-pay

## 2015-08-04 ENCOUNTER — Emergency Department (HOSPITAL_COMMUNITY): Payer: Medicare Other

## 2015-08-04 ENCOUNTER — Emergency Department (HOSPITAL_COMMUNITY)
Admission: EM | Admit: 2015-08-04 | Discharge: 2015-08-04 | Disposition: A | Discharge status: Home / Self Care | Payer: Medicare Other | Attending: Emergency Medicine | Admitting: Emergency Medicine

## 2015-08-04 ENCOUNTER — Encounter (HOSPITAL_COMMUNITY): Payer: Self-pay | Admitting: *Deleted

## 2015-08-04 DIAGNOSIS — Z7982 Long term (current) use of aspirin: Secondary | ICD-10-CM | POA: Diagnosis not present

## 2015-08-04 DIAGNOSIS — R55 Syncope and collapse: Secondary | ICD-10-CM | POA: Diagnosis not present

## 2015-08-04 DIAGNOSIS — N390 Urinary tract infection, site not specified: Secondary | ICD-10-CM

## 2015-08-04 DIAGNOSIS — Z79899 Other long term (current) drug therapy: Secondary | ICD-10-CM | POA: Insufficient documentation

## 2015-08-04 DIAGNOSIS — E785 Hyperlipidemia, unspecified: Secondary | ICD-10-CM | POA: Insufficient documentation

## 2015-08-04 HISTORY — DX: Unspecified dementia, unspecified severity, without behavioral disturbance, psychotic disturbance, mood disturbance, and anxiety: F03.90

## 2015-08-04 LAB — URINALYSIS, ROUTINE W REFLEX MICROSCOPIC
BILIRUBIN URINE: NEGATIVE
Glucose, UA: NEGATIVE mg/dL
Hgb urine dipstick: NEGATIVE
Ketones, ur: NEGATIVE mg/dL
NITRITE: NEGATIVE
PH: 6.5 (ref 5.0–8.0)
Protein, ur: NEGATIVE mg/dL
SPECIFIC GRAVITY, URINE: 1.011 (ref 1.005–1.030)

## 2015-08-04 LAB — BASIC METABOLIC PANEL
Anion gap: 5 (ref 5–15)
BUN: 17 mg/dL (ref 6–20)
CO2: 29 mmol/L (ref 22–32)
Calcium: 8.3 mg/dL — ABNORMAL LOW (ref 8.9–10.3)
Chloride: 106 mmol/L (ref 101–111)
Creatinine, Ser: 0.88 mg/dL (ref 0.44–1.00)
GFR calc Af Amer: >60 mL/min (ref >60)
GLUCOSE: 90 mg/dL (ref 65–99)
POTASSIUM: 4 mmol/L (ref 3.5–5.1)
Sodium: 140 mmol/L (ref 135–145)

## 2015-08-04 LAB — URINE MICROSCOPIC-ADD ON

## 2015-08-04 LAB — I-STAT TROPONIN, ED: Troponin i, poc: 0 ng/mL (ref 0.00–0.08)

## 2015-08-04 LAB — CBC
HEMATOCRIT: 43.2 % (ref 36.0–46.0)
Hemoglobin: 14.3 g/dL (ref 12.0–15.0)
MCH: 32 pg (ref 26.0–34.0)
MCHC: 33.1 g/dL (ref 30.0–36.0)
MCV: 96.6 fL (ref 78.0–100.0)
PLATELETS: 193 10*3/uL (ref 150–400)
RBC: 4.47 MIL/uL (ref 3.87–5.11)
RDW: 12.4 % (ref 11.5–15.5)
WBC: 8 10*3/uL (ref 4.0–10.5)

## 2015-08-04 MED ORDER — SODIUM CHLORIDE 0.9 % IV BOLUS (SEPSIS)
250.0000 mL | Freq: Once | INTRAVENOUS | Status: AC
  Administered 2015-08-04: 250 mL via INTRAVENOUS

## 2015-08-04 MED ORDER — SODIUM CHLORIDE 0.9 % IV SOLN
INTRAVENOUS | Status: DC
Start: 2015-08-04 — End: 2015-08-04
  Administered 2015-08-04: 13:00:00 via INTRAVENOUS

## 2015-08-04 MED ORDER — CEPHALEXIN 500 MG PO CAPS: 500.0000 mg | ORAL_CAPSULE | Freq: Four times a day (QID) | ORAL | Status: DC

## 2015-08-04 MED ORDER — DEXTROSE 5 % IV SOLN
1.0000 g | Freq: Once | INTRAVENOUS | Status: AC
  Administered 2015-08-04: 1 g via INTRAVENOUS
  Filled 2015-08-04: qty 10

## 2015-08-04 NOTE — ED Notes (Signed)
Patient transported to X-ray 

## 2015-08-04 NOTE — Discharge Instructions (Signed)
Urinalysis suggestive of urinary tract infection. Urine culture sent for confirmation. Patient received 1 dose of Rocephin IV piggyback here. Continue with the Keflex as directed for the next 7 days. Make an appointment to follow-up with primary care doctor. Return for any new or worse symptoms.

## 2015-08-04 NOTE — ED Notes (Addendum)
Per EMS - patient comes from home where she lives with her husband.  Patient had just taken a bath with a home health nurse and was sitting on the toilet (resting, not using it), and she had an unresponsive episode that lasted 30-60 seconds.  Home health nurse stated patient was slightly diaphoretic after incident, but EMS states house and bathroom were hot.  Patient's BP was 100/60 sitting and 80/46 standing, HR 60.  Patient received 200 ml of 0.9% NaCl in route.  CBG was 115.  Per home health nurse and patient's husband, patient is at baseline mental status.  Patient has dementia, HTN and a number of other issues.

## 2015-08-04 NOTE — ED Notes (Signed)
Bed: WA01 Expected date:  Expected time:  Means of arrival:  Comments: EMS-hypotensive

## 2015-08-04 NOTE — ED Notes (Signed)
Pt wheeled to car.  Pt ambulatory and independent at discharge.

## 2015-08-04 NOTE — ED Notes (Signed)
Patient transported to CT 

## 2015-08-04 NOTE — ED Provider Notes (Signed)
CSN: [id number]     Arrival date & time 08/04/15  1014 History   First MD Initiated Contact with Patient 08/04/15 1025     Chief Complaint  Patient presents with  . Loss of Consciousness     (Consider location/radiation/quality/duration/timing/severity/associated sxs/prior Treatment) Patient is a 74 y.o. female presenting with syncope. The history is provided by the EMS personnel and the spouse.  Loss of Consciousness Patient brought in by EMS. Patient has a history of dementia lives with her husband. Today there was a home health nurse present when patient had a brief syncopal episode did not fall was already sitting for 30 or 60 seconds. EMS noted that blood pressure standing was 80/46. According to patient's husband patient has been doing fine there was no evidence of any dullness or any concern. Patient's blood sugar was normal at the scene. Patient here has no complaints. She is alert.  Past Medical History  Diagnosis Date  . Memory loss   . Hyperlipidemia   . Dementia    Past Surgical History  Procedure Laterality Date  . Doppler echocardiography  Apr 07, 2010    NORMAL ECHO ,EF >55%,MILD IMPAIRED RELAXATION,MILD MR AND TR  . Nm myocar single w/spect  Apr 07 2010    EF 75% WITH NO ISCHEMIA OR INFARCTION  . Cardiac catheterization  10/2008    SHOWED LEFT DOMINANT SYSTEM WITH NO SIGNIFICANT DISEASE   Family History  Problem Relation Age of Onset  . Heart attack Father    Social History  Substance Use Topics  . Smoking status: Never Smoker   . Smokeless tobacco: None  . Alcohol Use: 2.4 oz/week    4 Standard drinks or equivalent per week     Comment: one glass of wine daily   OB History    No data available     Review of Systems  Unable to perform ROS: Dementia  Cardiovascular: Positive for syncope.      Allergies  Ciprofloxacin; Codeine; Levofloxacin; Niaspan; Sulfa antibiotics; and Vytorin  Home Medications   Prior to Admission medications   Medication  Sig Start Date End Date Taking? Authorizing Provider  acetaminophen (TYLENOL) 500 MG tablet Take 500-1,000 mg by mouth every 6 (six) hours as needed for mild pain, moderate pain, fever or headache.   Yes Historical Provider, MD  aspirin EC 81 MG tablet Take 81 mg by mouth daily.   Yes Historical Provider, MD  donepezil (ARICEPT) 10 MG tablet TAKE ONE TABLET AT BEDTIME. 04/21/15  Yes Huston Foley, MD  memantine (NAMENDA XR) 28 MG CP24 24 hr capsule Take 1 capsule (28 mg total) by mouth daily. 04/21/15  Yes Huston Foley, MD  ZETIA 10 MG tablet TAKE 1 TABLET EACH DAY. 11/30/14  Yes Marykay Lex, MD  cephALEXin (KEFLEX) 500 MG capsule Take 1 capsule (500 mg total) by mouth 4 (four) times daily. 08/04/15   Vanetta Mulders, MD   BP 126/103 mmHg  Pulse 65  Temp(Src) 97.8 F (36.6 C) (Oral)  Resp 11  SpO2 95% Physical Exam  Constitutional: She appears well-developed and well-nourished. No distress.  HENT:  Head: Normocephalic and atraumatic.  Mouth/Throat: Oropharynx is clear and moist.  Eyes: Conjunctivae and EOM are normal. Pupils are equal, round, and reactive to light.  Neck: Normal range of motion. Neck supple.  Cardiovascular: Normal rate and regular rhythm.   No murmur heard. Pulmonary/Chest: Effort normal and breath sounds normal. No respiratory distress.  Abdominal: Soft. Bowel sounds are normal. There is  no tenderness.  Musculoskeletal: Normal range of motion.  Neurological: She is alert. No cranial nerve deficit. She exhibits normal muscle tone. Coordination normal.  Skin: Skin is warm.  Nursing note and vitals reviewed.   ED Course  Procedures (including critical care time) Labs Review Labs Reviewed  BASIC METABOLIC PANEL - Abnormal; Notable for the following:    Calcium 8.3 (*)    All other components within normal limits  URINALYSIS, ROUTINE W REFLEX MICROSCOPIC (NOT AT [facility]) - Abnormal; Notable for the following:    APPearance CLOUDY (*)    Leukocytes, UA MODERATE (*)     All other components within normal limits  URINE MICROSCOPIC-ADD ON - Abnormal; Notable for the following:    Squamous Epithelial / LPF 0-5 (*)    Bacteria, UA MANY (*)    All other components within normal limits  URINE CULTURE  CBC  I-STAT TROPOININ, ED   Results for orders placed or performed during the hospital encounter of 08/04/15  CBC  Result Value Ref Range   WBC 8.0 4.0 - 10.5 K/uL   RBC 4.47 3.87 - 5.11 MIL/uL   Hemoglobin 14.3 12.0 - 15.0 g/dL   HCT 24.4 01.0 - 27.2 %   MCV 96.6 78.0 - 100.0 fL   MCH 32.0 26.0 - 34.0 pg   MCHC 33.1 30.0 - 36.0 g/dL   RDW 53.6 64.4 - 03.4 %   Platelets 193 150 - 400 K/uL  Basic metabolic panel  Result Value Ref Range   Sodium 140 135 - 145 mmol/L   Potassium 4.0 3.5 - 5.1 mmol/L   Chloride 106 101 - 111 mmol/L   CO2 29 22 - 32 mmol/L   Glucose, Bld 90 65 - 99 mg/dL   BUN 17 6 - 20 mg/dL   Creatinine, Ser 7.42 0.44 - 1.00 mg/dL   Calcium 8.3 (L) 8.9 - 10.3 mg/dL   GFR calc non Af Amer >60 >60 mL/min   GFR calc Af Amer >60 >60 mL/min   Anion gap 5 5 - 15  Urinalysis, Routine w reflex microscopic (not at [facility])  Result Value Ref Range   Color, Urine YELLOW YELLOW   APPearance CLOUDY (A) CLEAR   Specific Gravity, Urine 1.011 1.005 - 1.030   pH 6.5 5.0 - 8.0   Glucose, UA NEGATIVE NEGATIVE mg/dL   Hgb urine dipstick NEGATIVE NEGATIVE   Bilirubin Urine NEGATIVE NEGATIVE   Ketones, ur NEGATIVE NEGATIVE mg/dL   Protein, ur NEGATIVE NEGATIVE mg/dL   Nitrite NEGATIVE NEGATIVE   Leukocytes, UA MODERATE (A) NEGATIVE  Urine microscopic-add on  Result Value Ref Range   Squamous Epithelial / LPF 0-5 (A) NONE SEEN   WBC, UA 0-5 0 - 5 WBC/hpf   RBC / HPF 0-5 0 - 5 RBC/hpf   Bacteria, UA MANY (A) NONE SEEN  I-Stat Troponin, ED (not at [facility])  Result Value Ref Range   Troponin i, poc 0.00 0.00 - 0.08 ng/mL   Comment 3             Imaging Review Dg Chest 2 View  08/04/2015  CLINICAL DATA:  Syncope today. Diaphoresis. Dementia.  Hypertension. EXAM: CHEST  2 VIEW COMPARISON:  02/17/2011 FINDINGS: Low lung volumes are present, causing crowding of the pulmonary vasculature. Indistinct left basilar opacity may reflect atelectasis or early pneumonia. No pleural effusion. Heart size within normal limits. IMPRESSION: 1. Low lung volumes are present, causing crowding of the pulmonary vasculature. 2. Indistinct retrocardiac opacity on the left  suspicious for left lower lobe atelectasis or early pneumonia. Electronically Signed   By: Gaylyn RongWalter  Liebkemann M.D.   On: 08/04/2015 11:57   Ct Head Wo Contrast  08/04/2015  CLINICAL DATA:  Unresponsive episode on the toilet lasting about 1 minutes. Subsequent diaphoresis. Dementia, baseline mental status. EXAM: CT HEAD WITHOUT CONTRAST TECHNIQUE: Contiguous axial images were obtained from the base of the skull through the vertex without intravenous contrast. COMPARISON:  Multiple exams, including 06/24/2012 and 06/04/2012 FINDINGS: There is likely a trace amount of gas in the cavernous sinus, most likely incidental/iatrogenic and often introduced by IV. In the absence of a right to left shunt, small quantities of intravenous gas are typically inconsequential. The brainstem, cerebellum, cerebral peduncles, thalami, basal ganglia, basilar cisterns, and ventricular system appear within normal limits. Periventricular white matter and corona radiata hypodensities favor chronic ischemic microvascular white matter disease. No intracranial hemorrhage, mass lesion, or acute CVA. IMPRESSION: 1. No acute intracranial findings. 2. Periventricular white matter and corona radiata hypodensities favor chronic ischemic microvascular white matter disease. 3. Trace amount of gas in the left cavernous sinus, most likely iatrogenic and often introduced by IV. In the absence of a right to left cardiac shunt, small quantities of intravenous gas are typically inconsequential. Electronically Signed   By: Gaylyn RongWalter  Liebkemann M.D.    On: 08/04/2015 12:14   Ct Chest Wo Contrast  08/04/2015  CLINICAL DATA:  Syncope today.  Diaphoresis. EXAM: CT CHEST WITHOUT CONTRAST TECHNIQUE: Multidetector CT imaging of the chest was performed following the standard protocol without IV contrast. COMPARISON:  PA and lateral chest 08/04/2015 and 02/27/2011. FINDINGS: Heart size is upper normal. No pleural or pericardial effusion. No axillary, hilar or mediastinal lymphadenopathy. A few scattered aortic atherosclerotic calcifications are identified. The lungs demonstrate mild dependent atelectasis. Visualized upper abdomen is unremarkable. No focal bony abnormality is identified. IMPRESSION: No acute disease. Aortic atherosclerosis. Electronically Signed   By: Drusilla Kannerhomas  Dalessio M.D.   On: 08/04/2015 14:18   I have personally reviewed and evaluated these images and lab results as part of my medical decision-making.   EKG Interpretation   Date/Time:  Wednesday August 04 2015 10:17:07 EDT Ventricular Rate:  58 PR Interval:    QRS Duration: 75 QT Interval:  451 QTC Calculation: 443 R Axis:   59 Text Interpretation:  Sinus rhythm Confirmed by Vesta Wheeland  MD, Jaisen Wiltrout  [phone]([id number]) on 08/04/2015 4:11:43 PM      MDM   Final diagnoses:  Vasovagal syncope  UTI (lower urinary tract infection)    Patient brought in by EMS following a brief syncopal episode while a home health care nurse was there. Patient was sitting on the tall at was only out for 30 or 60 seconds. EMS noted blood pressure was 80/46 standing but was normal was sitting. Patient's blood sugar was normal.   Patient received fluids here blood pressure is normal with the standing, no orthostatic hypotension.  Extensive workup to include head CT chest x-ray and CT chest to rule out pneumonia all of which were negative. Urinalysis consistent with urinary tract infection. Urine culture sent. Patient treated with 1 g of Rocephin in the ED will be continued with Keflex.  Patient's mental status  is baseline. Patient does have the history of dementia. Patient has primary care doctor to follow-up with.    Vanetta MuldersScott Kden Wagster, MD 08/04/15 [id number]

## 2015-08-06 LAB — URINE CULTURE: Culture: >=100000 — AB

## 2015-08-07 ENCOUNTER — Telehealth (HOSPITAL_BASED_OUTPATIENT_CLINIC_OR_DEPARTMENT_OTHER): Payer: Self-pay

## 2015-08-07 NOTE — Telephone Encounter (Signed)
Post ED Visit - Positive Culture Follow-up  Culture report reviewed by antimicrobial stewardship pharmacist:  []  Enzo BiNathan Batchelder, Pharm.D. []  Celedonio MiyamotoJeremy Frens, Pharm.D., BCPS []  Garvin FilaMike Maccia, Pharm.D. []  Georgina PillionElizabeth Martin, Pharm.D., BCPS []  YoeMinh Pham, [address]Pharm[address]D., BCPS, AAHIVP []  Estella HuskMichelle Turner, Pharm.D., BCPS, AAHIVP []  Tennis Mustassie Stewart, Pharm.D. []  Rob Oswaldo DoneVincent, [address]Pharm[address] Revonda StandardAllison Masters Pharm D Positive urine culture Treated with Cephalexin, organism sensitive to the same and no further patient follow-up is required at this time.  Jerry CarasCullom, Shine Mikes Burnett 08/07/2015, 11:01 AM

## 2015-09-01 ENCOUNTER — Emergency Department (HOSPITAL_COMMUNITY)
Admission: EM | Admit: 2015-09-01 | Discharge: 2015-09-01 | Disposition: A | Discharge status: Home / Self Care | Payer: Medicare Other | Attending: Emergency Medicine | Admitting: Emergency Medicine

## 2015-09-01 ENCOUNTER — Encounter (HOSPITAL_COMMUNITY): Payer: Self-pay | Admitting: Emergency Medicine

## 2015-09-01 ENCOUNTER — Emergency Department (HOSPITAL_COMMUNITY): Payer: Medicare Other

## 2015-09-01 DIAGNOSIS — Z791 Long term (current) use of non-steroidal anti-inflammatories (NSAID): Secondary | ICD-10-CM | POA: Insufficient documentation

## 2015-09-01 DIAGNOSIS — N201 Calculus of ureter: Secondary | ICD-10-CM | POA: Diagnosis not present

## 2015-09-01 DIAGNOSIS — G309 Alzheimer's disease, unspecified: Secondary | ICD-10-CM | POA: Diagnosis not present

## 2015-09-01 DIAGNOSIS — Z7982 Long term (current) use of aspirin: Secondary | ICD-10-CM | POA: Insufficient documentation

## 2015-09-01 DIAGNOSIS — E785 Hyperlipidemia, unspecified: Secondary | ICD-10-CM | POA: Diagnosis not present

## 2015-09-01 DIAGNOSIS — Z79899 Other long term (current) drug therapy: Secondary | ICD-10-CM | POA: Diagnosis not present

## 2015-09-01 DIAGNOSIS — N23 Unspecified renal colic: Secondary | ICD-10-CM

## 2015-09-01 DIAGNOSIS — R1032 Left lower quadrant pain: Secondary | ICD-10-CM | POA: Diagnosis present

## 2015-09-01 LAB — COMPREHENSIVE METABOLIC PANEL
ALT: 12 U/L — AB (ref 14–54)
AST: 22 U/L (ref 15–41)
Albumin: 4.3 g/dL (ref 3.5–5.0)
Alkaline Phosphatase: 78 U/L (ref 38–126)
Anion gap: 8 (ref 5–15)
BUN: 23 mg/dL — AB (ref 6–20)
CHLORIDE: 103 mmol/L (ref 101–111)
CO2: 28 mmol/L (ref 22–32)
CREATININE: 1.18 mg/dL — AB (ref 0.44–1.00)
Calcium: 9 mg/dL (ref 8.9–10.3)
GFR calc non Af Amer: 43 mL/min — ABNORMAL LOW (ref >60)
GFR, EST AFRICAN AMERICAN: 50 mL/min — AB (ref >60)
Glucose, Bld: 140 mg/dL — ABNORMAL HIGH (ref 65–99)
POTASSIUM: 4.1 mmol/L (ref 3.5–5.1)
SODIUM: 139 mmol/L (ref 135–145)
Total Bilirubin: 0.5 mg/dL (ref 0.3–1.2)
Total Protein: 7.5 g/dL (ref 6.5–8.1)

## 2015-09-01 LAB — URINALYSIS, ROUTINE W REFLEX MICROSCOPIC
Bilirubin Urine: NEGATIVE
Glucose, UA: NEGATIVE mg/dL
KETONES UR: NEGATIVE mg/dL
Nitrite: NEGATIVE
PROTEIN: 30 mg/dL — AB
SPECIFIC GRAVITY, URINE: 1.026 (ref 1.005–1.030)
pH: 6 (ref 5.0–8.0)

## 2015-09-01 LAB — CBC WITH DIFFERENTIAL/PLATELET
BASOS ABS: 0 10*3/uL (ref 0.0–0.1)
Basophils Relative: 0 %
EOS ABS: 0 10*3/uL (ref 0.0–0.7)
EOS PCT: 0 %
HCT: 44.3 % (ref 36.0–46.0)
Hemoglobin: 15 g/dL (ref 12.0–15.0)
Lymphocytes Relative: 5 %
Lymphs Abs: 0.6 10*3/uL — ABNORMAL LOW (ref 0.7–4.0)
MCH: 32.3 pg (ref 26.0–34.0)
MCHC: 33.9 g/dL (ref 30.0–36.0)
MCV: 95.5 fL (ref 78.0–100.0)
MONO ABS: 0.8 10*3/uL (ref 0.1–1.0)
Monocytes Relative: 6 %
Neutro Abs: 10.9 10*3/uL — ABNORMAL HIGH (ref 1.7–7.7)
Neutrophils Relative %: 89 %
PLATELETS: 213 10*3/uL (ref 150–400)
RBC: 4.64 MIL/uL (ref 3.87–5.11)
RDW: 12.2 % (ref 11.5–15.5)
WBC: 12.4 10*3/uL — AB (ref 4.0–10.5)

## 2015-09-01 LAB — I-STAT CG4 LACTIC ACID, ED
LACTIC ACID, VENOUS: 1.39 mmol/L (ref 0.5–1.9)
LACTIC ACID, VENOUS: 1 mmol/L (ref 0.5–1.9)

## 2015-09-01 LAB — URINE MICROSCOPIC-ADD ON

## 2015-09-01 LAB — LIPASE, BLOOD: LIPASE: 31 U/L (ref 11–51)

## 2015-09-01 MED ORDER — ONDANSETRON HCL 4 MG/2ML IJ SOLN
4.0000 mg | Freq: Once | INTRAMUSCULAR | Status: AC
  Administered 2015-09-01: 4 mg via INTRAVENOUS
  Filled 2015-09-01: qty 2

## 2015-09-01 MED ORDER — HYDROCODONE-ACETAMINOPHEN 5-325 MG PO TABS: 1.0000 | ORAL_TABLET | Freq: Four times a day (QID) | ORAL | Status: DC | PRN

## 2015-09-01 MED ORDER — HYDROMORPHONE HCL 1 MG/ML IJ SOLN
0.5000 mg | INTRAMUSCULAR | Status: DC | PRN
  Administered 2015-09-01: 0.5 mg via INTRAVENOUS
  Filled 2015-09-01: qty 1

## 2015-09-01 MED ORDER — DIATRIZOATE MEGLUMINE & SODIUM 66-10 % PO SOLN
15.0000 mL | Freq: Once | ORAL | Status: AC
  Administered 2015-09-01: 15 mL via ORAL

## 2015-09-01 MED ORDER — IOPAMIDOL (ISOVUE-300) INJECTION 61%
100.0000 mL | Freq: Once | INTRAVENOUS | Status: AC | PRN
  Administered 2015-09-01: 100 mL via INTRAVENOUS

## 2015-09-01 MED ORDER — SODIUM CHLORIDE 0.9 % IV BOLUS (SEPSIS)
500.0000 mL | Freq: Once | INTRAVENOUS | Status: AC
  Administered 2015-09-01: 500 mL via INTRAVENOUS

## 2015-09-01 MED ORDER — ONDANSETRON 4 MG PO TBDP: 4.0000 mg | ORAL_TABLET | Freq: Three times a day (TID) | ORAL | Status: DC | PRN

## 2015-09-01 MED ORDER — CEPHALEXIN 500 MG PO CAPS: 500.0000 mg | ORAL_CAPSULE | Freq: Four times a day (QID) | ORAL | Status: DC

## 2015-09-01 NOTE — Discharge Instructions (Signed)
You were seen and evaluated today for your abdominal pain. This appears to be secondary to a kidney stone. Please use the pain medication prescribed and take the antibiotics prescribed as well. Follow up with the urologist as soon as possible outpatient. Return for fever, intractable vomiting, inability to eat or drink, or inability to control her pain.  Renal Colic Renal colic is pain that is caused by passing a kidney stone. The pain can be sharp and severe. It may be felt in the back, abdomen, side (flank), or groin. It can cause nausea. Renal colic can come and go. HOME CARE INSTRUCTIONS Watch your condition for any changes. The following actions may help to lessen any discomfort that you are feeling:  Take medicines only as directed by your health care provider.  Ask your health care provider if it is okay to take over-the-counter pain medicine.  Drink enough fluid to keep your urine clear or pale yellow. Drink 6-8 glasses of water each day.  Limit the amount of salt that you eat to less than 2 grams per day.  Reduce the amount of protein in your diet. Eat less meat, fish, nuts, and dairy.  Avoid foods such as spinach, rhubarb, nuts, or bran. These may make kidney stones more likely to form. SEEK MEDICAL CARE IF:  You have a fever or chills.  Your urine smells bad or looks cloudy.  You have pain or burning when you pass urine. SEEK IMMEDIATE MEDICAL CARE IF:  Your flank pain or groin pain suddenly worsens.  You become confused or disoriented or you lose consciousness.   This information is not intended to replace advice given to you by your health care provider. Make sure you discuss any questions you have with your health care provider.   Document Released: 11/09/2004 Document Revised: 02/20/2014 Document Reviewed: 12/10/2013 Elsevier Interactive Patient Education 2016 Elsevier Inc.   Kidney Stones Kidney stones (urolithiasis) are deposits that form inside your kidneys.  The intense pain is caused by the stone moving through the urinary tract. When the stone moves, the ureter goes into spasm around the stone. The stone is usually passed in the urine.  CAUSES   A disorder that makes certain neck glands produce too much parathyroid hormone (primary hyperparathyroidism).  A buildup of uric acid crystals, similar to gout in your joints.  Narrowing (stricture) of the ureter.  A kidney obstruction present at birth (congenital obstruction).  Previous surgery on the kidney or ureters.  Numerous kidney infections. SYMPTOMS   Feeling sick to your stomach (nauseous).  Throwing up (vomiting).  Blood in the urine (hematuria).  Pain that usually spreads (radiates) to the groin.  Frequency or urgency of urination. DIAGNOSIS   Taking a history and physical exam.  Blood or urine tests.  CT scan.  Occasionally, an examination of the inside of the urinary bladder (cystoscopy) is performed. TREATMENT   Observation.  Increasing your fluid intake.  Extracorporeal shock wave lithotripsy--This is a noninvasive procedure that uses shock waves to break up kidney stones.  Surgery may be needed if you have severe pain or persistent obstruction. There are various surgical procedures. Most of the procedures are performed with the use of small instruments. Only small incisions are needed to accommodate these instruments, so recovery time is minimized. The size, location, and chemical composition are all important variables that will determine the proper choice of action for you. Talk to your health care provider to better understand your situation so that you will minimize  the risk of injury to yourself and your kidney.  HOME CARE INSTRUCTIONS   Drink enough water and fluids to keep your urine clear or pale yellow. This will help you to pass the stone or stone fragments.  Strain all urine through the provided strainer. Keep all particulate matter and stones for your  health care provider to see. The stone causing the pain may be as small as a grain of salt. It is very important to use the strainer each and every time you pass your urine. The collection of your stone will allow your health care provider to analyze it and verify that a stone has actually passed. The stone analysis will often identify what you can do to reduce the incidence of recurrences.  Only take over-the-counter or prescription medicines for pain, discomfort, or fever as directed by your health care provider.  Keep all follow-up visits as told by your health care provider. This is important.  Get follow-up X-rays if required. The absence of pain does not always mean that the stone has passed. It may have only stopped moving. If the urine remains completely obstructed, it can cause loss of kidney function or even complete destruction of the kidney. It is your responsibility to make sure X-rays and follow-ups are completed. Ultrasounds of the kidney can show blockages and the status of the kidney. Ultrasounds are not associated with any radiation and can be performed easily in a matter of minutes.  Make changes to your daily diet as told by your health care provider. You may be told to:  Limit the amount of salt that you eat.  Eat 5 or more servings of fruits and vegetables each day.  Limit the amount of meat, poultry, fish, and eggs that you eat.  Collect a 24-hour urine sample as told by your health care provider.You may need to collect another urine sample every 6-12 months. SEEK MEDICAL CARE IF:  You experience pain that is progressive and unresponsive to any pain medicine you have been prescribed. SEEK IMMEDIATE MEDICAL CARE IF:   Pain cannot be controlled with the prescribed medicine.  You have a fever or shaking chills.  The severity or intensity of pain increases over 18 hours and is not relieved by pain medicine.  You develop a new onset of abdominal pain.  You feel faint  or pass out.  You are unable to urinate.   This information is not intended to replace advice given to you by your health care provider. Make sure you discuss any questions you have with your health care provider.   Document Released: 01/30/2005 Document Revised: 10/21/2014 Document Reviewed: 07/03/2012 Elsevier Interactive Patient Education [employer] Else[employer] Inc.

## 2015-09-01 NOTE — ED Notes (Signed)
Patient transported to CT 

## 2015-09-01 NOTE — ED Notes (Signed)
Pt ambulated to restroom with assistance.  

## 2015-09-01 NOTE — ED Provider Notes (Signed)
CSN: [id number]     Arrival date & time 09/01/15  1052 History   First MD Initiated Contact with Patient 09/01/15 1119     Chief Complaint  Patient presents with  . Abdominal Pain     (Consider location/radiation/quality/duration/timing/severity/associated sxs/prior Treatment) HPI Comments: 75 year old female with history of Alzheimer's, hyperlipidemia presents with her husband for concern for abdominal pain. The patient is able to answer simple questions but not able to give history secondary to her dementia. Husband at bedside says that the patient has appeared to be in her normal state of health yesterday and this morning until around 9 AM. She ate a banana and a breakfast bar without difficulty. She then started complaining of abdominal pain. The patient has not had fever. No vomiting but has complained of feeling sick on her stomach. Had a normal bowel movement yesterday per her husband. No diarrhea or constipation that he noted.  Patient is a 75 y.o. female presenting with abdominal pain.  Abdominal Pain Associated symptoms: nausea     Past Medical History  Diagnosis Date  . Memory loss   . Hyperlipidemia   . Dementia    Past Surgical History  Procedure Laterality Date  . Doppler echocardiography  Apr 07, 2010    NORMAL ECHO ,EF >55%,MILD IMPAIRED RELAXATION,MILD MR AND TR  . Nm myocar single w/spect  Apr 07 2010    EF 75% WITH NO ISCHEMIA OR INFARCTION  . Cardiac catheterization  10/2008    SHOWED LEFT DOMINANT SYSTEM WITH NO SIGNIFICANT DISEASE   Family History  Problem Relation Age of Onset  . Heart attack Father    Social History  Substance Use Topics  . Smoking status: Never Smoker   . Smokeless tobacco: None  . Alcohol Use: 2.4 oz/week    4 Standard drinks or equivalent per week     Comment: one glass of wine daily   OB History    No data available     Review of Systems  Unable to perform ROS: Dementia  Gastrointestinal: Positive for nausea and abdominal  pain.      Allergies  Ciprofloxacin; Codeine; Levofloxacin; Niaspan; Sulfa antibiotics; and Vytorin  Home Medications   Prior to Admission medications   Medication Sig Start Date End Date Taking? Authorizing Provider  acetaminophen (TYLENOL) 500 MG tablet Take 500-1,000 mg by mouth every 6 (six) hours as needed for mild pain, moderate pain, fever or headache.   Yes Historical Provider, MD  aspirin EC 81 MG tablet Take 81 mg by mouth daily.   Yes Historical Provider, MD  donepezil (ARICEPT) 10 MG tablet TAKE ONE TABLET AT BEDTIME. 04/21/15  Yes Huston Foley, MD  memantine (NAMENDA XR) 28 MG CP24 24 hr capsule Take 1 capsule (28 mg total) by mouth daily. 04/21/15  Yes Huston Foley, MD  ZETIA 10 MG tablet TAKE 1 TABLET EACH DAY. 11/30/14  Yes Marykay Lex, MD  cephALEXin (KEFLEX) 500 MG capsule Take 1 capsule (500 mg total) by mouth 4 (four) times daily. 09/01/15   Leta Baptist, MD  HYDROcodone-acetaminophen (NORCO) 5-325 MG tablet Take 1-2 tablets by mouth every 6 (six) hours as needed for moderate pain or severe pain. 09/01/15   Leta Baptist, MD  ondansetron (ZOFRAN ODT) 4 MG disintegrating tablet Take 1 tablet (4 mg total) by mouth every 8 (eight) hours as needed for nausea or vomiting. 09/01/15   Leta Baptist, MD   BP 137/68 mmHg  Pulse 63  Temp(Src) 97.9  F (36.6 C) (Oral)  Resp 18  SpO2 99% Physical Exam  Constitutional: She is oriented to person, place, and time. She appears well-developed and well-nourished. No distress.  HENT:  Head: Normocephalic and atraumatic.  Right Ear: External ear normal.  Left Ear: External ear normal.  Nose: Nose normal.  Mouth/Throat: Oropharynx is clear and moist. No oropharyngeal exudate.  Eyes: EOM are normal. Pupils are equal, round, and reactive to light.  Neck: Normal range of motion. Neck supple.  Cardiovascular: Normal rate, regular rhythm, normal heart sounds and intact distal pulses.   No murmur heard. Pulmonary/Chest: Effort  normal. No respiratory distress. She has no wheezes. She has no rales.  Abdominal: Soft. She exhibits no distension and no mass. There is tenderness (generalized but most tender in the left lower quadrant on examination). There is rebound. There is no guarding.  Musculoskeletal: Normal range of motion. She exhibits no edema or tenderness.  Neurological: She is alert and oriented to person, place, and time.  Skin: Skin is warm and dry. No rash noted. She is not diaphoretic.  Vitals reviewed.   ED Course  Procedures (including critical care time) Labs Review Labs Reviewed  CBC WITH DIFFERENTIAL/PLATELET - Abnormal; Notable for the following:    WBC 12.4 (*)    Neutro Abs 10.9 (*)    Lymphs Abs 0.6 (*)    All other components within normal limits  COMPREHENSIVE METABOLIC PANEL - Abnormal; Notable for the following:    Glucose, Bld 140 (*)    BUN 23 (*)    Creatinine, Ser 1.18 (*)    ALT 12 (*)    GFR calc non Af Amer 43 (*)    GFR calc Af Amer 50 (*)    All other components within normal limits  URINALYSIS, ROUTINE W REFLEX MICROSCOPIC (NOT AT [facility]) - Abnormal; Notable for the following:    APPearance TURBID (*)    Hgb urine dipstick LARGE (*)    Protein, ur 30 (*)    Leukocytes, UA MODERATE (*)    All other components within normal limits  URINE MICROSCOPIC-ADD ON - Abnormal; Notable for the following:    Squamous Epithelial / LPF 0-5 (*)    Bacteria, UA FEW (*)    All other components within normal limits  URINE CULTURE  LIPASE, BLOOD  I-STAT CG4 LACTIC ACID, ED  I-STAT CG4 LACTIC ACID, ED    Imaging Review Ct Abdomen Pelvis W Contrast  09/01/2015  CLINICAL DATA:  Left lower quadrant abdominal pain since this morning. EXAM: CT ABDOMEN AND PELVIS WITH CONTRAST TECHNIQUE: Multidetector CT imaging of the abdomen and pelvis was performed using the standard protocol following bolus administration of intravenous contrast. CONTRAST:  100mL ISOVUE-300 IOPAMIDOL (ISOVUE-300)  INJECTION 61% COMPARISON:  08/08/2013. FINDINGS: Lower chest:  Compressive atelectasis noted both lung bases. Hepatobiliary: No focal abnormality within the liver parenchyma. There is no evidence for gallstones, gallbladder wall thickening, or pericholecystic fluid. No intrahepatic or extrahepatic biliary dilation. Pancreas: Slight diffuse prominence of the main pancreatic duct is unchanged in the interval. No focal pancreatic mass lesion evident. Spleen: No splenomegaly. No focal mass lesion. Adrenals/Urinary Tract: No adrenal nodule or mass. Right kidney is normal in appearance. There is mild fullness of the left intrarenal collecting system. 5 x 6 x 7 mm stone is identified at the left UPJ. No other left ureteral stone. No right ureteral stone. No bladder stone. Stomach/Bowel: Stomach is nondistended. No gastric wall thickening. No evidence of outlet obstruction. Duodenum is  normally positioned as is the ligament of Treitz. No small bowel wall thickening. No small bowel dilatation. The terminal ileum is normal. The appendix is not visualized, but there is no edema or inflammation in the region of the cecum. No gross colonic mass. No colonic wall thickening. No substantial diverticular change. Vascular/Lymphatic: No abdominal aortic aneurysm. Persistent left-sided IVC noted. No abdominal aortic atherosclerotic calcification. There is no gastrohepatic or hepatoduodenal ligament lymphadenopathy. No intraperitoneal or retroperitoneal lymphadenopathy. Reproductive: Without uterus surgically absent. There is no adnexal mass. Other: No intraperitoneal free fluid. Musculoskeletal: Bone windows reveal no worrisome lytic or sclerotic osseous lesions. IMPRESSION: 1. 5 x 6 x 7 mm left UPJ stone causes mild secondary changes left kidney. 2. Otherwise no acute findings in the abdomen or pelvis. Electronically Signed   By: Kennith Center M.D.   On: 09/01/2015 14:45   I have personally reviewed and evaluated these images and  lab results as part of my medical decision-making.   EKG Interpretation None      MDM  Patient seen and evaluated in stable condition.  Patient non toxic but with abdominal tenderness on examination.  Pain controlled after one dose of pain medication.  UA mildly contaminated with some mild signs of infection.  5x6x7 mm left UPJ stone.  Mild leukocytosis, afebrile.  Discussed with urologist on call who agreed with Plan for discharge at this time. He also recommended in light of patient's complicated social status and dementia that it will be helpful to cover her with antibiotics. He recommended Bactrim the patient has a sulfa allergy and so Keflex was ordered. Patient is to follow-up with [facility] outpatient. I discussed all results and plan of care with patient's husband who express understanding and agreement. Patient has no history of CVA, MI, coronary stents and so was asked to not taking her aspirin until she follows up with the urologist. Patient was discharged home in stable condition with strict return precautions. Final diagnoses:  Renal colic  Ureteral stone    1. Renal colic 2. Left ureteral stone    Leta Baptist, MD 09/01/15 [id number]

## 2015-09-01 NOTE — ED Notes (Signed)
Pt c/o LLQ abdominal pain since this morning. Denies N/V/D. Hx dementia.

## 2015-09-02 LAB — URINE CULTURE: Culture: NO GROWTH

## 2015-09-03 ENCOUNTER — Encounter (HOSPITAL_COMMUNITY): Payer: Self-pay | Admitting: *Deleted

## 2015-09-03 ENCOUNTER — Other Ambulatory Visit: Payer: Self-pay | Admitting: Urology

## 2015-09-06 ENCOUNTER — Ambulatory Visit (HOSPITAL_COMMUNITY): Payer: Medicare Other

## 2015-09-06 ENCOUNTER — Encounter (HOSPITAL_COMMUNITY)
Admission: RE | Disposition: A | Discharge status: Home / Self Care | Payer: Self-pay | Source: Ambulatory Visit | Attending: Urology

## 2015-09-06 ENCOUNTER — Ambulatory Visit (HOSPITAL_COMMUNITY)
Admission: RE | Admit: 2015-09-06 | Discharge: 2015-09-06 | Disposition: A | Discharge status: Home / Self Care | Payer: Medicare Other | Source: Ambulatory Visit | Attending: Urology | Admitting: Urology

## 2015-09-06 ENCOUNTER — Encounter (HOSPITAL_COMMUNITY): Payer: Self-pay | Admitting: General Practice

## 2015-09-06 DIAGNOSIS — Z79899 Other long term (current) drug therapy: Secondary | ICD-10-CM | POA: Insufficient documentation

## 2015-09-06 DIAGNOSIS — N201 Calculus of ureter: Secondary | ICD-10-CM | POA: Insufficient documentation

## 2015-09-06 DIAGNOSIS — Z7982 Long term (current) use of aspirin: Secondary | ICD-10-CM | POA: Diagnosis not present

## 2015-09-06 DIAGNOSIS — F0390 Unspecified dementia without behavioral disturbance: Secondary | ICD-10-CM | POA: Diagnosis not present

## 2015-09-06 DIAGNOSIS — E785 Hyperlipidemia, unspecified: Secondary | ICD-10-CM | POA: Diagnosis not present

## 2015-09-06 HISTORY — DX: Chronic kidney disease, unspecified: N18.9

## 2015-09-06 SURGERY — LITHOTRIPSY, ESWL
Anesthesia: LOCAL | Laterality: Left

## 2015-09-06 MED ORDER — SODIUM CHLORIDE 0.9 % IV SOLN
INTRAVENOUS | Status: DC
  Administered 2015-09-06: 10:00:00 via INTRAVENOUS

## 2015-09-06 MED ORDER — DIAZEPAM 5 MG PO TABS
10.0000 mg | ORAL_TABLET | ORAL | Status: AC
  Administered 2015-09-06: 10 mg via ORAL
  Filled 2015-09-06: qty 2

## 2015-09-06 MED ORDER — DIPHENHYDRAMINE HCL 25 MG PO CAPS
25.0000 mg | ORAL_CAPSULE | ORAL | Status: AC
  Administered 2015-09-06: 25 mg via ORAL
  Filled 2015-09-06: qty 1

## 2015-09-06 NOTE — Discharge Instructions (Signed)
See [facility] discharge instructions in chart.  

## 2015-09-06 NOTE — H&P (Signed)
  H&P  Chief Complaint: Kidney stone  History of Present Illness: Hannah Soto is a 75 y.o. year old female who presents for ESL of a left upper ureteral stone.  Past Medical History:  Diagnosis Date  . Dementia   . Hyperlipidemia   . Memory loss     Past Surgical History:  Procedure Laterality Date  . CARDIAC CATHETERIZATION  10/2008   SHOWED LEFT DOMINANT SYSTEM WITH NO SIGNIFICANT DISEASE  . DOPPLER ECHOCARDIOGRAPHY  Apr 07, 2010   NORMAL ECHO ,EF >55%,MILD IMPAIRED RELAXATION,MILD MR AND TR  . NM MYOCAR SINGLE W/SPECT  Apr 07 2010   EF 75% WITH NO ISCHEMIA OR INFARCTION    Home Medications:  No prescriptions prior to admission.    Allergies:  Allergies  Allergen Reactions  . Ciprofloxacin Other (See Comments)    Can't remember.  . Codeine     Can't remember   . Levofloxacin     Can't remember  . Niaspan [Niacin] Other (See Comments)    intolerant  . Sulfa Antibiotics   . Vytorin [Ezetimibe-Simvastatin] Other (See Comments)    myalgias    Family History  Problem Relation Age of Onset  . Heart attack Father     Social History:  reports that she has never smoked. She does not have any smokeless tobacco history on file. She reports that she drinks about 2.4 oz of alcohol per week . She reports that she does not use drugs.  ROS: A complete review of systems was performed.  All systems are negative except for pertinent findings as noted.  Physical Exam:  Vital signs in last 24 hours:   General:  Alert and oriented, No acute distress HEENT: Normocephalic, atraumatic Neck: No JVD or lymphadenopathy Cardiovascular: Regular rate and rhythm Lungs: Clear bilaterally Abdomen: Soft, nontender, nondistended, no abdominal masses. Obese. Back: No CVA tenderness Extremities: No edema Neurologic: Grossly intact  Laboratory Data:  No results found for this or any previous visit (from the past 24 hour(s)). Recent Results (from the past 240 hour(s))  Urine  culture     Status: None   Collection Time: 09/01/15 11:54 AM  Result Value Ref Range Status   Specimen Description URINE, CLEAN CATCH  Final   Special Requests NONE  Final   Culture NO GROWTH Performed at [facility]   Final   Report Status 09/02/2015 FINAL  Final   Creatinine:  Recent Labs  09/01/15 1209  CREATININE 1.18*    Radiologic Imaging: No results found.  Impression/Assessment:  5 mm left upper ureteral stone--550 HU, SSD 11 cm  Plan:  Left ESL  Chelsea Aus 09/06/2015, 7:34 AM  Bertram Millard. Cecil Vandyke MD

## 2015-09-06 NOTE — Op Note (Signed)
See [facility] OP note scanned into chart. 

## 2015-09-27 ENCOUNTER — Other Ambulatory Visit: Payer: Self-pay | Admitting: Cardiology

## 2015-09-27 NOTE — Telephone Encounter (Signed)
Rx(s) sent to pharmacy electronically.  

## 2015-11-01 ENCOUNTER — Other Ambulatory Visit: Payer: Self-pay | Admitting: Cardiology

## 2015-11-02 NOTE — Telephone Encounter (Signed)
Rx has been sent to the pharmacy electronically. ° °

## 2015-12-27 ENCOUNTER — Emergency Department (HOSPITAL_COMMUNITY)
Admission: EM | Admit: 2015-12-27 | Discharge: 2015-12-27 | Disposition: A | Discharge status: Home / Self Care | Payer: Medicare Other | Attending: Emergency Medicine | Admitting: Emergency Medicine

## 2015-12-27 ENCOUNTER — Emergency Department (HOSPITAL_COMMUNITY): Payer: Medicare Other

## 2015-12-27 ENCOUNTER — Encounter (HOSPITAL_COMMUNITY): Payer: Self-pay

## 2015-12-27 DIAGNOSIS — R4182 Altered mental status, unspecified: Secondary | ICD-10-CM | POA: Diagnosis present

## 2015-12-27 DIAGNOSIS — R410 Disorientation, unspecified: Secondary | ICD-10-CM | POA: Diagnosis not present

## 2015-12-27 DIAGNOSIS — I10 Essential (primary) hypertension: Secondary | ICD-10-CM | POA: Diagnosis not present

## 2015-12-27 DIAGNOSIS — Z79899 Other long term (current) drug therapy: Secondary | ICD-10-CM | POA: Insufficient documentation

## 2015-12-27 DIAGNOSIS — Z7982 Long term (current) use of aspirin: Secondary | ICD-10-CM | POA: Diagnosis not present

## 2015-12-27 LAB — COMPREHENSIVE METABOLIC PANEL
ALBUMIN: 3.6 g/dL (ref 3.5–5.0)
ALK PHOS: 69 U/L (ref 38–126)
ALT: 11 U/L — ABNORMAL LOW (ref 14–54)
ANION GAP: 12 (ref 5–15)
AST: 23 U/L (ref 15–41)
BILIRUBIN TOTAL: 0.8 mg/dL (ref 0.3–1.2)
BUN: 21 mg/dL — AB (ref 6–20)
CALCIUM: 9 mg/dL (ref 8.9–10.3)
CO2: 24 mmol/L (ref 22–32)
Chloride: 105 mmol/L (ref 101–111)
Creatinine, Ser: 1.08 mg/dL — ABNORMAL HIGH (ref 0.44–1.00)
GFR calc Af Amer: 56 mL/min — ABNORMAL LOW (ref >60)
GFR, EST NON AFRICAN AMERICAN: 48 mL/min — AB (ref >60)
GLUCOSE: 169 mg/dL — AB (ref 65–99)
Potassium: 3.5 mmol/L (ref 3.5–5.1)
Sodium: 141 mmol/L (ref 135–145)
TOTAL PROTEIN: 6.2 g/dL — AB (ref 6.5–8.1)

## 2015-12-27 LAB — CBC WITH DIFFERENTIAL/PLATELET
BASOS PCT: 1 %
Basophils Absolute: 0 10*3/uL (ref 0.0–0.1)
Eosinophils Absolute: 0.1 10*3/uL (ref 0.0–0.7)
Eosinophils Relative: 1 %
HEMATOCRIT: 42.7 % (ref 36.0–46.0)
HEMOGLOBIN: 14.4 g/dL (ref 12.0–15.0)
LYMPHS ABS: 3.4 10*3/uL (ref 0.7–4.0)
LYMPHS PCT: 43 %
MCH: 32.2 pg (ref 26.0–34.0)
MCHC: 33.7 g/dL (ref 30.0–36.0)
MCV: 95.5 fL (ref 78.0–100.0)
MONO ABS: 0.5 10*3/uL (ref 0.1–1.0)
MONOS PCT: 6 %
NEUTROS ABS: 3.8 10*3/uL (ref 1.7–7.7)
NEUTROS PCT: 49 %
Platelets: 235 10*3/uL (ref 150–400)
RBC: 4.47 MIL/uL (ref 3.87–5.11)
RDW: 12.4 % (ref 11.5–15.5)
WBC: 7.8 10*3/uL (ref 4.0–10.5)

## 2015-12-27 LAB — URINALYSIS, ROUTINE W REFLEX MICROSCOPIC
GLUCOSE, UA: NEGATIVE mg/dL
Hgb urine dipstick: NEGATIVE
KETONES UR: NEGATIVE mg/dL
Leukocytes, UA: NEGATIVE
Nitrite: NEGATIVE
PH: 5.5 (ref 5.0–8.0)
Protein, ur: NEGATIVE mg/dL
Specific Gravity, Urine: 1.031 — ABNORMAL HIGH (ref 1.005–1.030)

## 2015-12-27 LAB — CBG MONITORING, ED: Glucose-Capillary: 159 mg/dL — ABNORMAL HIGH (ref 65–99)

## 2015-12-27 LAB — I-STAT CG4 LACTIC ACID, ED: Lactic Acid, Venous: 1.86 mmol/L (ref 0.5–1.9)

## 2015-12-27 MED ORDER — SODIUM CHLORIDE 0.9 % IV SOLN
1000.0000 mL | Freq: Once | INTRAVENOUS | Status: AC
  Administered 2015-12-27: 1000 mL via INTRAVENOUS

## 2015-12-27 NOTE — Discharge Instructions (Signed)
Please contact Oletta Cohnamellia Wood, RN, BSN ([facility] Case Manager - [facility]) If you would like additional assistance at home with a Home Health Agency  Please see the list of home health agencies provided to you by the Care Management Team

## 2015-12-27 NOTE — ED Provider Notes (Signed)
[facility]-EMERGENCY DEPT Provider Note   CSN: [id number] Arrival date & time: 12/27/15  1021     History   Chief Complaint Chief Complaint  Patient presents with  . Altered Mental Status    HPI Hannah Soto is a 75 y.o. female.  HPI   Pt with hx Alzheimer dementia, HLD, kidney stones p/w decreased LOC gradually over weeks but significantly more today.  She has had more difficulty expressing herself over the past few weeks, is not eating as well.  Today she has not been very verbal and has been staring off, not communicating and not responding well to commands.  Has not had BM in several days. No fevers or recent illness.  Lives at home with assistance of part time aide 3 times/week.  Level V caveat for dementia.    Past Medical History:  Diagnosis Date  . Dementia   . Hyperlipidemia   . kidney stones   . Memory loss     Patient Active Problem List   Diagnosis Date Noted  . Essential hypertension 05/02/2013  . Mixed hyperlipidemia   . Memory loss 06/11/2012    Past Surgical History:  Procedure Laterality Date  . CARDIAC CATHETERIZATION  10/2008   SHOWED LEFT DOMINANT SYSTEM WITH NO SIGNIFICANT DISEASE  . DOPPLER ECHOCARDIOGRAPHY  Apr 07, 2010   NORMAL ECHO ,EF >55%,MILD IMPAIRED RELAXATION,MILD MR AND TR  . NM MYOCAR SINGLE W/SPECT  Apr 07 2010   EF 75% WITH NO ISCHEMIA OR INFARCTION    OB History    No data available       Home Medications    Prior to Admission medications   Medication Sig Start Date End Date Taking? Authorizing Provider  acetaminophen (TYLENOL) 500 MG tablet Take 500-1,000 mg by mouth every 6 (six) hours as needed for mild pain, moderate pain, fever or headache.    Historical Provider, MD  aspirin EC 81 MG tablet Take 81 mg by mouth daily.    Historical Provider, MD  cephALEXin (KEFLEX) 500 MG capsule Take 1 capsule (500 mg total) by mouth 4 (four) times daily. 09/01/15   Leta BaptistEmily Roe Nguyen, MD  donepezil (ARICEPT) 10 MG tablet TAKE ONE  TABLET AT BEDTIME. 04/21/15   Huston FoleySaima Athar, MD  ezetimibe (ZETIA) 10 MG tablet Take 1 tablet (10 mg total) by mouth daily. Need appointment before anymore refills 11/02/15   Marykay Lexavid W Harding, MD  HYDROcodone-acetaminophen [facility](NORCO) 5-325 MG tablet Take 1-2 tablets by mouth every 6 (six) hours as needed for moderate pain or severe pain. 09/01/15   Leta BaptistEmily Roe Nguyen, MD  memantine (NAMENDA XR) 28 MG CP24 24 hr capsule Take 1 capsule (28 mg total) by mouth daily. 04/21/15   Huston FoleySaima Athar, MD  ondansetron (ZOFRAN ODT) 4 MG disintegrating tablet Take 1 tablet (4 mg total) by mouth every 8 (eight) hours as needed for nausea or vomiting. 09/01/15   Leta BaptistEmily Roe Nguyen, MD    Family History Family History  Problem Relation Age of Onset  . Heart attack Father     Social History Social History  Substance Use Topics  . Smoking status: Never Smoker  . Smokeless tobacco: Never Used  . Alcohol use 2.4 oz/week    4 Standard drinks or equivalent per week     Comment: one glass of wine daily     Allergies   Ciprofloxacin; Codeine; Levofloxacin; Niaspan [niacin]; Sulfa antibiotics; and Vytorin [ezetimibe-simvastatin]   Review of Systems Review of Systems  Unable to perform ROS:  Dementia     Physical Exam Updated Vital Signs BP (!) 140/102   Pulse 96   Temp 97.8 F (36.6 C)   Resp 17   Ht 5\' 3"  (1.6 m)   Wt 56.7 kg   SpO2 97%   BMI 22.14 kg/m   Physical Exam  Constitutional: She appears well-developed and well-nourished. No distress.  Pt keeps her eyes closed.  Responds occasionally to commands and occasionally answers questions.  Voice is clear.    HENT:  Head: Normocephalic and atraumatic.  Eyes: Conjunctivae are normal. Right eye exhibits no discharge. Left eye exhibits discharge.  Neck: Neck supple.  Cardiovascular: Normal rate and regular rhythm.   Pulmonary/Chest: Effort normal and breath sounds normal. No respiratory distress. She has no wheezes. She has no rales.  Abdominal: Soft. She  exhibits no distension. There is tenderness (upper abdomen). There is no rebound and no guarding.  Musculoskeletal: She exhibits no edema, tenderness or deformity.  Neurological: She is alert.  Skin: She is not diaphoretic.  Nursing note and vitals reviewed.    ED Treatments / Results  Labs (all labs ordered are listed, but only abnormal results are displayed) Labs Reviewed  COMPREHENSIVE METABOLIC PANEL - Abnormal; Notable for the following:       Result Value   Glucose, Bld 169 (*)    BUN 21 (*)    Creatinine, Ser 1.08 (*)    Total Protein 6.2 (*)    ALT 11 (*)    GFR calc non Af Amer 48 (*)    GFR calc Af Amer 56 (*)    All other components within normal limits  URINALYSIS, ROUTINE W REFLEX MICROSCOPIC (NOT AT [facility]) - Abnormal; Notable for the following:    Specific Gravity, Urine 1.031 (*)    Bilirubin Urine SMALL (*)    All other components within normal limits  CBG MONITORING, ED - Abnormal; Notable for the following:    Glucose-Capillary 159 (*)    All other components within normal limits  URINE CULTURE  CBC WITH DIFFERENTIAL/PLATELET  I-STAT CG4 LACTIC ACID, ED    EKG  EKG Interpretation  Date/Time:  Monday December 27 2015 10:52:03 EST Ventricular Rate:  62 PR Interval:    QRS Duration: 93 QT Interval:  446 QTC Calculation: 453 R Axis:   48 Text Interpretation:  Normal sinus rhythm Low voltage, precordial leads Minimal ST elevation, inferior leads No significant change was found Confirmed by CAMPOS  MD, Caryn Bee ([id number]) on 12/27/2015 2:30:55 PM       Radiology Ct Head Wo Contrast  Result Date: 12/27/2015 CLINICAL DATA:  Altered mental status EXAM: CT HEAD WITHOUT CONTRAST TECHNIQUE: Contiguous axial images were obtained from the base of the skull through the vertex without intravenous contrast. COMPARISON:  08/04/2015 FINDINGS: Brain: Diffuse parenchymal atrophy. Patchy areas of hypoattenuation in deep and periventricular white matter bilaterally. Negative  for acute intracranial hemorrhage, mass lesion, acute infarction, midline shift, or mass-effect. Acute infarct may be inapparent on noncontrast CT. Ventricles and sulci symmetric. Early left basal ganglia mineralization, stable since prior study. Vascular: Atherosclerotic and physiologic intracranial calcifications. Skull: Bone windows demonstrate no focal lesion. Sinuses/Orbits: No acute finding. Other: None. IMPRESSION: 1. Negative for bleed or other acute intracranial process. 2. Atrophy and nonspecific white matter changes. Electronically Signed   By: Corlis Leak M.D.   On: 12/27/2015 11:34   Dg Chest Portable 1 View  Result Date: 12/27/2015 CLINICAL DATA:  Syncope EXAM: PORTABLE CHEST 1 VIEW COMPARISON:  August 04, 2015 FINDINGS: There is no edema or consolidation. The heart size and pulmonary vascularity. No adenopathy. No pneumothorax. No bone lesions. IMPRESSION: No edema or consolidation. Electronically Signed   By: Bretta BangWilliam  Woodruff III M.D.   On: 12/27/2015 11:25    Procedures Procedures (including critical care time)  Medications Ordered in ED Medications  0.9 %  sodium chloride infusion (0 mLs Intravenous Stopped 12/27/15 1509)     Initial Impression / Assessment and Plan / ED Course  I have reviewed the triage vital signs and the nursing notes.  Pertinent labs & imaging results that were available during my care of the patient were reviewed by me and considered in my medical decision making (see chart for details).  Clinical Course     Afebrile nontoxic patient with hx dementia with worsening condition over past few weeks and significant decrease in responsiveness and alertness today.  Extensive workup with significant abnormality.  Pt occasionally opening eyes and responding.  Pt also seen by Dr Patria Maneampos.  Dr Patria Maneampos assisted family, discussing plans for increased levels of care, increased help at home.  Case manager also involved to assist husband with this.  Husband comfortable  with discharge home.    Final Clinical Impressions(s) / ED Diagnoses   Final diagnoses:  Confusion    New Prescriptions Discharge Medication List as of 12/27/2015  2:50 PM       Trixie Dredgemily Vicy Medico, PA-C 12/27/15 1708    Azalia BilisKevin Campos, MD 12/28/15 [phone]

## 2015-12-27 NOTE — ED Notes (Signed)
Husband states he understands instructions. Pt home stable with husband via w/c. All questions answered,.

## 2015-12-27 NOTE — ED Notes (Signed)
Placed patient into a gown and on the monitor waiting for provider 

## 2015-12-27 NOTE — ED Triage Notes (Signed)
Pt arrives EMS with c/o less responsive this morning. Caregiver tells EMS that dementia worse over last several weeks but "gazing into distance after shower." Pt denies complaints.

## 2015-12-27 NOTE — ED Notes (Signed)
Did in and cath on patient for urine sample

## 2015-12-27 NOTE — ED Notes (Signed)
Checked patient blood sugar it was 159 notified RN of blood sugar

## 2015-12-28 LAB — URINE CULTURE: CULTURE: NO GROWTH

## 2015-12-30 ENCOUNTER — Other Ambulatory Visit: Payer: Self-pay | Admitting: Cardiology

## 2015-12-30 NOTE — Telephone Encounter (Signed)
Rx(s) sent to pharmacy electronically.  

## 2016-02-21 ENCOUNTER — Encounter (HOSPITAL_COMMUNITY): Payer: Self-pay

## 2016-02-21 ENCOUNTER — Emergency Department (HOSPITAL_COMMUNITY): Payer: Medicare Other

## 2016-02-21 ENCOUNTER — Observation Stay (HOSPITAL_COMMUNITY)
Admission: EM | Admit: 2016-02-21 | Discharge: 2016-02-23 | Disposition: A | Discharge status: Home / Self Care | Payer: Medicare Other | Attending: Family Medicine | Admitting: Family Medicine

## 2016-02-21 DIAGNOSIS — E43 Unspecified severe protein-calorie malnutrition: Secondary | ICD-10-CM

## 2016-02-21 DIAGNOSIS — Z7982 Long term (current) use of aspirin: Secondary | ICD-10-CM | POA: Insufficient documentation

## 2016-02-21 DIAGNOSIS — S4992XA Unspecified injury of left shoulder and upper arm, initial encounter: Secondary | ICD-10-CM | POA: Diagnosis present

## 2016-02-21 DIAGNOSIS — E860 Dehydration: Secondary | ICD-10-CM | POA: Diagnosis not present

## 2016-02-21 DIAGNOSIS — S42032A Displaced fracture of lateral end of left clavicle, initial encounter for closed fracture: Principal | ICD-10-CM | POA: Insufficient documentation

## 2016-02-21 DIAGNOSIS — E876 Hypokalemia: Secondary | ICD-10-CM | POA: Diagnosis not present

## 2016-02-21 DIAGNOSIS — R627 Adult failure to thrive: Secondary | ICD-10-CM | POA: Diagnosis not present

## 2016-02-21 DIAGNOSIS — E782 Mixed hyperlipidemia: Secondary | ICD-10-CM | POA: Diagnosis present

## 2016-02-21 DIAGNOSIS — I10 Essential (primary) hypertension: Secondary | ICD-10-CM | POA: Diagnosis not present

## 2016-02-21 DIAGNOSIS — F0390 Unspecified dementia without behavioral disturbance: Secondary | ICD-10-CM

## 2016-02-21 DIAGNOSIS — Y999 Unspecified external cause status: Secondary | ICD-10-CM | POA: Diagnosis not present

## 2016-02-21 DIAGNOSIS — W19XXXA Unspecified fall, initial encounter: Secondary | ICD-10-CM | POA: Diagnosis not present

## 2016-02-21 DIAGNOSIS — Z79899 Other long term (current) drug therapy: Secondary | ICD-10-CM | POA: Insufficient documentation

## 2016-02-21 DIAGNOSIS — R531 Weakness: Secondary | ICD-10-CM | POA: Diagnosis not present

## 2016-02-21 DIAGNOSIS — R413 Other amnesia: Secondary | ICD-10-CM | POA: Diagnosis present

## 2016-02-21 DIAGNOSIS — Y929 Unspecified place or not applicable: Secondary | ICD-10-CM | POA: Insufficient documentation

## 2016-02-21 DIAGNOSIS — Y939 Activity, unspecified: Secondary | ICD-10-CM | POA: Insufficient documentation

## 2016-02-21 DIAGNOSIS — E46 Unspecified protein-calorie malnutrition: Secondary | ICD-10-CM | POA: Diagnosis present

## 2016-02-21 DIAGNOSIS — F03C0 Unspecified dementia, severe, without behavioral disturbance, psychotic disturbance, mood disturbance, and anxiety: Secondary | ICD-10-CM | POA: Diagnosis present

## 2016-02-21 LAB — CBC WITH DIFFERENTIAL/PLATELET
BASOS PCT: 0 %
Basophils Absolute: 0 10*3/uL (ref 0.0–0.1)
EOS ABS: 0.2 10*3/uL (ref 0.0–0.7)
Eosinophils Relative: 3 %
HEMATOCRIT: 40.8 % (ref 36.0–46.0)
Hemoglobin: 13.5 g/dL (ref 12.0–15.0)
Lymphocytes Relative: 16 %
Lymphs Abs: 1.3 10*3/uL (ref 0.7–4.0)
MCH: 31.8 pg (ref 26.0–34.0)
MCHC: 33.1 g/dL (ref 30.0–36.0)
MCV: 96 fL (ref 78.0–100.0)
MONO ABS: 1 10*3/uL (ref 0.1–1.0)
MONOS PCT: 13 %
Neutro Abs: 5.5 10*3/uL (ref 1.7–7.7)
Neutrophils Relative %: 68 %
Platelets: 233 10*3/uL (ref 150–400)
RBC: 4.25 MIL/uL (ref 3.87–5.11)
RDW: 12.6 % (ref 11.5–15.5)
WBC: 8 10*3/uL (ref 4.0–10.5)

## 2016-02-21 LAB — CBC
HEMATOCRIT: 42.3 % (ref 36.0–46.0)
Hemoglobin: 14.5 g/dL (ref 12.0–15.0)
MCH: 32.9 pg (ref 26.0–34.0)
MCHC: 34.3 g/dL (ref 30.0–36.0)
MCV: 95.9 fL (ref 78.0–100.0)
PLATELETS: 193 10*3/uL (ref 150–400)
RBC: 4.41 MIL/uL (ref 3.87–5.11)
RDW: 12.6 % (ref 11.5–15.5)
WBC: 6 10*3/uL (ref 4.0–10.5)

## 2016-02-21 LAB — URINALYSIS, ROUTINE W REFLEX MICROSCOPIC
BILIRUBIN URINE: NEGATIVE
GLUCOSE, UA: NEGATIVE mg/dL
HGB URINE DIPSTICK: NEGATIVE
Ketones, ur: NEGATIVE mg/dL
Leukocytes, UA: NEGATIVE
Nitrite: NEGATIVE
Protein, ur: NEGATIVE mg/dL
SPECIFIC GRAVITY, URINE: 1.026 (ref 1.005–1.030)
pH: 5 (ref 5.0–8.0)

## 2016-02-21 LAB — I-STAT CG4 LACTIC ACID, ED: LACTIC ACID, VENOUS: 1 mmol/L (ref 0.5–1.9)

## 2016-02-21 LAB — CREATININE, SERUM: Creatinine, Ser: 0.65 mg/dL (ref 0.44–1.00)

## 2016-02-21 LAB — BASIC METABOLIC PANEL
Anion gap: 9 (ref 5–15)
BUN: 20 mg/dL (ref 6–20)
CALCIUM: 8.5 mg/dL — AB (ref 8.9–10.3)
CO2: 29 mmol/L (ref 22–32)
CREATININE: 0.73 mg/dL (ref 0.44–1.00)
Chloride: 103 mmol/L (ref 101–111)
GFR calc Af Amer: >60 mL/min (ref >60)
GFR calc non Af Amer: >60 mL/min (ref >60)
GLUCOSE: 109 mg/dL — AB (ref 65–99)
Potassium: 3.4 mmol/L — ABNORMAL LOW (ref 3.5–5.1)
Sodium: 141 mmol/L (ref 135–145)

## 2016-02-21 LAB — TSH: TSH: 0.969 u[IU]/mL (ref 0.350–4.500)

## 2016-02-21 MED ORDER — ONDANSETRON HCL 4 MG PO TABS: 4.0000 mg | ORAL_TABLET | Freq: Four times a day (QID) | ORAL | Status: DC | PRN

## 2016-02-21 MED ORDER — SODIUM CHLORIDE 0.9 % IV BOLUS (SEPSIS)
500.0000 mL | Freq: Once | INTRAVENOUS | Status: AC
  Administered 2016-02-21: 500 mL via INTRAVENOUS

## 2016-02-21 MED ORDER — ENOXAPARIN SODIUM 40 MG/0.4ML ~~LOC~~ SOLN
40.0000 mg | SUBCUTANEOUS | Status: DC
  Administered 2016-02-21: 40 mg via SUBCUTANEOUS
  Filled 2016-02-21: qty 0.4

## 2016-02-21 MED ORDER — ONDANSETRON 4 MG PO TBDP: 4.0000 mg | ORAL_TABLET | Freq: Three times a day (TID) | ORAL | Status: DC | PRN

## 2016-02-21 MED ORDER — POTASSIUM CHLORIDE IN NACL 40-0.9 MEQ/L-% IV SOLN
INTRAVENOUS | Status: DC
  Administered 2016-02-21 – 2016-02-22 (×2): 100 mL/h via INTRAVENOUS
  Filled 2016-02-21 (×2): qty 1000

## 2016-02-21 MED ORDER — ACETAMINOPHEN 325 MG PO TABS: 650.0000 mg | ORAL_TABLET | Freq: Four times a day (QID) | ORAL | Status: DC | PRN

## 2016-02-21 MED ORDER — EZETIMIBE 10 MG PO TABS
10.0000 mg | ORAL_TABLET | Freq: Every day | ORAL | Status: DC
  Administered 2016-02-21 – 2016-02-23 (×3): 10 mg via ORAL
  Filled 2016-02-21 (×3): qty 1

## 2016-02-21 MED ORDER — KETOROLAC TROMETHAMINE 15 MG/ML IJ SOLN: 15.0000 mg | Freq: Four times a day (QID) | INTRAMUSCULAR | Status: DC | PRN

## 2016-02-21 MED ORDER — ONDANSETRON HCL 4 MG/2ML IJ SOLN: 4.0000 mg | Freq: Four times a day (QID) | INTRAMUSCULAR | Status: DC | PRN

## 2016-02-21 MED ORDER — SODIUM CHLORIDE 0.9 % IV SOLN
INTRAVENOUS | Status: DC
  Administered 2016-02-21: 125 mL/h via INTRAVENOUS

## 2016-02-21 MED ORDER — SENNOSIDES-DOCUSATE SODIUM 8.6-50 MG PO TABS: 1.0000 | ORAL_TABLET | Freq: Every evening | ORAL | Status: DC | PRN

## 2016-02-21 MED ORDER — ACETAMINOPHEN 650 MG RE SUPP: 650.0000 mg | Freq: Four times a day (QID) | RECTAL | Status: DC | PRN

## 2016-02-21 MED ORDER — DONEPEZIL HCL 10 MG PO TABS
10.0000 mg | ORAL_TABLET | Freq: Every day | ORAL | Status: DC
  Administered 2016-02-21 – 2016-02-22 (×2): 10 mg via ORAL
  Filled 2016-02-21 (×2): qty 1

## 2016-02-21 MED ORDER — ASPIRIN EC 81 MG PO TBEC
81.0000 mg | DELAYED_RELEASE_TABLET | Freq: Every day | ORAL | Status: DC
  Administered 2016-02-21 – 2016-02-23 (×3): 81 mg via ORAL
  Filled 2016-02-21 (×3): qty 1

## 2016-02-21 NOTE — Progress Notes (Signed)
L arm sling placed by [employer].

## 2016-02-21 NOTE — ED Notes (Signed)
Bed: WA04 Expected date:  Expected time:  Means of arrival:  Comments: EMS dementia/fall 

## 2016-02-21 NOTE — ED Triage Notes (Signed)
Per EMS- Patient is from home. Patient's daughter reports increased weakness and decreased po intake x 2 days. patient has a history of dementia.

## 2016-02-21 NOTE — Progress Notes (Signed)
Patient from ED. Alert, disoriented to time,place and situation. Does not follow commands. Hx of dementia per husband. Vital signs obtained. Placed comfortably in bed. Family present when transported to room and husband was updated with present meds and MD orders. Will continue to monitor.

## 2016-02-21 NOTE — ED Provider Notes (Signed)
[facility]-EMERGENCY DEPT Provider Note   CSN: [id number] Arrival date & time: 02/21/16  1005     History   Chief Complaint Chief Complaint  Patient presents with  . Weakness    HPI Hannah Soto is a 76 y.o. female.  Patient presents by EMS for evaluation and possible admission. Patient's husband is helping manage her at home with the assistance of an aid and feels that she is very different than her baseline. Patient is unable to walk well, and weak and has injured her left shoulder, in a fall. She is eating, however, requiring more assistance than usual. Patient is chronically ill, with dementia, needing assistance to ambulate and do her usual activities of daily living. She has been unable to get out of bed even with help for the last several days. There has been no noted cough, fever, or vomiting. There are no other no changes in her medical condition.  Level V caveat- dementia  HPI  Past Medical History:  Diagnosis Date  . Dementia   . Hyperlipidemia   . kidney stones   . Memory loss     Patient Active Problem List   Diagnosis Date Noted  . Failure to thrive in adult 02/21/2016  . Essential hypertension 05/02/2013  . Mixed hyperlipidemia   . Memory loss 06/11/2012    Past Surgical History:  Procedure Laterality Date  . CARDIAC CATHETERIZATION  10/2008   SHOWED LEFT DOMINANT SYSTEM WITH NO SIGNIFICANT DISEASE  . DOPPLER ECHOCARDIOGRAPHY  Apr 07, 2010   NORMAL ECHO ,EF >55%,MILD IMPAIRED RELAXATION,MILD MR AND TR  . NM MYOCAR SINGLE W/SPECT  Apr 07 2010   EF 75% WITH NO ISCHEMIA OR INFARCTION    OB History    No data available       Home Medications    Prior to Admission medications   Medication Sig Start Date End Date Taking? Authorizing Provider  acetaminophen (TYLENOL) 500 MG tablet Take 500-1,000 mg by mouth every 6 (six) hours as needed for mild pain, moderate pain, fever or headache.   Yes Historical Provider, MD  aspirin EC 81 MG tablet Take 81 mg by  mouth daily.   Yes Historical Provider, MD  ezetimibe (ZETIA) 10 MG tablet Take 1 tablet (10 mg total) by mouth daily. PLEASE CONTACT OFFICE FOR ADDITIONAL REFILLS 2ND ATTEMPT 12/30/15  Yes Marykay Lexavid W Harding, MD  HYDROcodone-acetaminophen [facility]) 5-325 MG tablet Take 1-2 tablets by mouth every 6 (six) hours as needed for moderate pain or severe pain. 09/01/15  Yes Leta BaptistEmily Roe Nguyen, MD  ondansetron (ZOFRAN ODT) 4 MG disintegrating tablet Take 1 tablet (4 mg total) by mouth every 8 (eight) hours as needed for nausea or vomiting. 09/01/15  Yes Leta BaptistEmily Roe Nguyen, MD  cephALEXin (KEFLEX) 500 MG capsule Take 1 capsule (500 mg total) by mouth 4 (four) times daily. Patient not taking: Reported on 02/21/2016 09/01/15   Leta BaptistEmily Roe Nguyen, MD  donepezil (ARICEPT) 10 MG tablet TAKE ONE TABLET AT BEDTIME. Patient not taking: Reported on 02/21/2016 04/21/15   Huston FoleySaima Athar, MD  memantine (NAMENDA XR) 28 MG CP24 24 hr capsule Take 1 capsule (28 mg total) by mouth daily. Patient not taking: Reported on 02/21/2016 04/21/15   Huston FoleySaima Athar, MD    Family History Family History  Problem Relation Age of Onset  . Heart attack Father     Social History Social History  Substance Use Topics  . Smoking status: Never Smoker  . Smokeless tobacco: Never Used  .  Alcohol use 2.4 oz/week    4 Standard drinks or equivalent per week     Comment: one glass of wine daily     Allergies   Ciprofloxacin; Codeine; Levofloxacin; Niaspan [niacin]; Sulfa antibiotics; and Vytorin [ezetimibe-simvastatin]   Review of Systems Review of Systems  Unable to perform ROS: Dementia     Physical Exam Updated Vital Signs BP 123/67   Pulse 82 Comment: Simultaneous filing. User may not have seen previous data.  Temp 100 F (37.8 C) (Rectal)   Resp 13   Ht 5\' 3"  (1.6 m)   Wt 125 lb (56.7 kg)   SpO2 95% Comment: Simultaneous filing. User may not have seen previous data.  BMI 22.14 kg/m   Physical Exam  Constitutional: She appears  well-developed. She has a sickly appearance.  Elderly, frail  HENT:  Head: Normocephalic and atraumatic.  Patient resists my opening her mouth, actively. Lips do not appear significantly dehydrated.  Eyes: Conjunctivae and EOM are normal. Pupils are equal, round, and reactive to light. Right eye exhibits no discharge. Left eye exhibits no discharge.  Neck: Normal range of motion and phonation normal. Neck supple.  Cardiovascular: Normal rate and regular rhythm.   Pulmonary/Chest: Effort normal and breath sounds normal. No respiratory distress. She has no wheezes. She exhibits no tenderness.  Abdominal: Soft. She exhibits no distension. There is no tenderness. There is no guarding.  Musculoskeletal:  Generalized joint immobility and stiffness, likely age-related. Large amount of ecchymosis over left anterior and posterior shoulder, with some pain on passive range of motion of the left shoulder. No other extremity deformity.  Neurological: She is alert. She exhibits normal muscle tone.  Patient is occasionally responsive when talking about food, which she likes. There is no clear focal asymmetry of strength. She does not cooperate with strength testing. She does not seem to be dysarthric.  Skin: Skin is warm and dry.  Psychiatric:  Lethargic  Nursing note and vitals reviewed.    ED Treatments / Results  Labs (all labs ordered are listed, but only abnormal results are displayed) Labs Reviewed  BASIC METABOLIC PANEL - Abnormal; Notable for the following:       Result Value   Potassium 3.4 (*)    Glucose, Bld 109 (*)    Calcium 8.5 (*)    All other components within normal limits  URINALYSIS, ROUTINE W REFLEX MICROSCOPIC - Abnormal; Notable for the following:    Color, Urine AMBER (*)    All other components within normal limits  CBC WITH DIFFERENTIAL/PLATELET  I-STAT CG4 LACTIC ACID, ED    EKG  EKG Interpretation None       Radiology Dg Chest 2 View  Result Date:  02/21/2016 CLINICAL DATA:  Fall 2 days ago.  Bruising of the left shoulder. EXAM: CHEST  2 VIEW COMPARISON:  Left shoulder radiographs from the same day. FINDINGS: The heart size is normal. The lungs are clear. There is no edema or effusion. A displaced distal left clavicle fracture is again noted. IMPRESSION: 1. Displaced distal left clavicle fracture. 2. No acute cardiopulmonary disease. Electronically Signed   By: Marin Roberts M.D.   On: 02/21/2016 11:48   Dg Shoulder Left  Result Date: 02/21/2016 CLINICAL DATA:  Weakness, fall 2 days ago, left shoulder bruising EXAM: LEFT SHOULDER - 2+ VIEW COMPARISON:  None. FINDINGS: Two views of the left shoulder submitted. There is displaced fracture of distal left clavicle. No shoulder dislocation. IMPRESSION: Displaced fracture of distal left clavicle. No  shoulder dislocation. Electronically Signed   By: Natasha Mead M.D.   On: 02/21/2016 11:38    Procedures Procedures (including critical care time)  Medications Ordered in ED Medications  0.9 %  sodium chloride infusion (125 mL/hr Intravenous New Bag/Given 02/21/16 1327)  sodium chloride 0.9 % bolus 500 mL (0 mLs Intravenous Stopped 02/21/16 1247)     Initial Impression / Assessment and Plan / ED Course  I have reviewed the triage vital signs and the nursing notes.  Pertinent labs & imaging results that were available during my care of the patient were reviewed by me and considered in my medical decision making (see chart for details).  Clinical Course as of Feb 20 1650  Mon Feb 21, 2016  1528 Glucose: (!) 109 [EW]  1528 Nonoperative clavicle fracture DG Shoulder Left [EW]  1528 No acute lung disorder DG Chest 2 View [EW]    Clinical Course User Index [EW] Mancel Bale, MD    Medications  0.9 %  sodium chloride infusion (125 mL/hr Intravenous New Bag/Given 02/21/16 1327)  sodium chloride 0.9 % bolus 500 mL (0 mLs Intravenous Stopped 02/21/16 1247)    Patient Vitals for the past 24 hrs:   BP Temp Temp src Pulse Resp SpO2 Height Weight  02/21/16 1600 123/67 - - - 13 - - -  02/21/16 1530 144/65 - - 82 11 95 % - -  02/21/16 1330 131/66 - - 79 23 96 % - -  02/21/16 1200 128/56 - - 72 11 99 % - -  02/21/16 1137 - 100 F (37.8 C) Rectal - - - - -  02/21/16 1045 117/69 - - 104 12 (!) 84 % - -  02/21/16 1027 105/92 97.5 F (36.4 C) Oral 88 18 100 % - -  02/21/16 1022 - - - - - - 5\' 3"  (1.6 m) 125 lb (56.7 kg)  02/21/16 1015 - - - - - 93 % - -    3:15 PM Reevaluation with update and discussion. After initial assessment and treatment, an updated evaluation reveals No change in patients clinical status. Family is wondering when she will be "admitted." Nursing has not obtained urinalysis yet. Family updated on findings. Matteo Banke L   3:15 PM-Consult complete with Hospitalist. Patient case explained and discussed. He agrees to admit patient for further evaluation and treatment. Call ended at 16:30  Final Clinical Impressions(s) / ED Diagnoses   Final diagnoses:  Weakness  Fall, initial encounter  Closed displaced fracture of acromial end of left clavicle, initial encounter    Elderly female, with falls and increasing confusion, without serious injury. Left shoulder injury with clavicle fracture, is present. Patient is decompensated, and is not doing well with home treatment. She will likely need to evaluate further for debility with physical therapy, and may require placement.  Nursing Notes Reviewed/ Care Coordinated, and agree without changes. Applicable Imaging Reviewed.  Interpretation of Laboratory Data incorporated into ED treatment   New Prescriptions New Prescriptions   No medications on file     Mancel Bale, MD 02/21/16 1652

## 2016-02-21 NOTE — ED Triage Notes (Signed)
Patient fell 4 days ago and now has bruising and pain to the left shoulder. Patient's family reports that they have been icing the left shoulder.

## 2016-02-21 NOTE — H&P (Addendum)
[facility] H&P   Patient Demographics:    Hannah Soto, is a 76 y.o. female  MRN: [id number]   DOB - [birth year]  Admit Date - 02/21/2016  Outpatient Primary MD for the patient is Gaye Alken, MD  Referring MD: Dr Effie Shy  Outpatient Specialists: none  Patient coming from: home  Chief Complaint  Patient presents with  . Weakness    History provided by patient's husband and son at bedside since patient is severely demented.   HPI:    Hannah Soto  is a 76 y.o. female,History of advanced dementia, hyperlipidemia who is usually homebound and needs assistance with all her ADLs was brought to the ED by her husband due to progressive weakness for the past 2 weeks. Until 2 months back patient was able to walk almost half a mile in the community. Gradually she started becoming weak and for the past 2 weeks she has been increasingly weak needing significant assistance even to take a few steps. Her bedroom is on the second floor and patient's husband is having a lot of difficulty to help her take the stairs. Her appetite has also decreased. One week back patient fell from the chair while being seated during her meal and landed on her left shoulder. She developed a bruise around it. Husband and son have noticed that patient is increasingly confused as well. (Has significant dementia at baseline). Husband denies any fever, chills, nausea, vomiting, chest pain, shortness of breath, abdominal pain, diarrhea, hematuria or foul-smelling urine. Denies head injury sustained during the fall. No recent illness, sick contacts or travel. No new medications. Husband reports extreme difficulty taking care of her at home.   Course in the ED Patient had a temperature of 100 F, heart rate of 100, stable blood pressure. Initial oxygen was 84% improved to 2 L via nasal cannula. Blood will showed normal CBC.  Chemistry showed potassium of 3.4, glucose of 109 and normal lactic acid. Chest x-ray was unremarkable except for displaced distal left clavicle. UA was negative for infection.  Hospitalist consulted for observation for progressive failure to thrive and PT evaluation.   Review of systems:    As outlined in history of present illness above. Full 10 point review of systems Limited due to patient's dementia.    With Past History of the following :    Past Medical History:  Diagnosis Date  . Dementia   . Hyperlipidemia   . kidney stones   . Memory loss       Past Surgical History:  Procedure Laterality Date  . CARDIAC CATHETERIZATION  10/2008   SHOWED LEFT DOMINANT SYSTEM WITH NO SIGNIFICANT DISEASE  . DOPPLER ECHOCARDIOGRAPHY  Apr 07, 2010   NORMAL ECHO ,EF >55%,MILD IMPAIRED RELAXATION,MILD MR AND TR  . NM MYOCAR SINGLE W/SPECT  Apr 07 2010   EF 75% WITH NO ISCHEMIA OR INFARCTION  Social History:     Social History  Substance Use Topics  . Smoking status: Never Smoker  . Smokeless tobacco: Never Used  . Alcohol use 2.4 oz/week    4 Standard drinks or equivalent per week     Comment: one glass of wine daily     Lives - At home with husband  Mobility - ambulated with some support until recently, now fully dependent     Family History :     Family History  Problem Relation Age of Onset  . Heart attack Father       Home Medications:   Prior to Admission medications   Medication Sig Start Date End Date Taking? Authorizing Provider  acetaminophen (TYLENOL) 500 MG tablet Take 500-1,000 mg by mouth every 6 (six) hours as needed for mild pain, moderate pain, fever or headache.   Yes Historical Provider, MD  aspirin EC 81 MG tablet Take 81 mg by mouth daily.   Yes Historical Provider, MD  ezetimibe (ZETIA) 10 MG tablet Take 1 tablet (10 mg total) by mouth daily. PLEASE CONTACT OFFICE FOR ADDITIONAL REFILLS 2ND ATTEMPT 12/30/15  Yes Marykay Lex, MD    HYDROcodone-acetaminophen [facility]) 5-325 MG tablet Take 1-2 tablets by mouth every 6 (six) hours as needed for moderate pain or severe pain. 09/01/15  Yes Leta Baptist, MD  ondansetron (ZOFRAN ODT) 4 MG disintegrating tablet Take 1 tablet (4 mg total) by mouth every 8 (eight) hours as needed for nausea or vomiting. 09/01/15  Yes Leta Baptist, MD  cephALEXin (KEFLEX) 500 MG capsule Take 1 capsule (500 mg total) by mouth 4 (four) times daily. Patient not taking: Reported on 02/21/2016 09/01/15   Leta Baptist, MD  donepezil (ARICEPT) 10 MG tablet TAKE ONE TABLET AT BEDTIME. Patient not taking: Reported on 02/21/2016 04/21/15   Huston Foley, MD  memantine (NAMENDA XR) 28 MG CP24 24 hr capsule Take 1 capsule (28 mg total) by mouth daily. Patient not taking: Reported on 02/21/2016 04/21/15   Huston Foley, MD     Allergies:     Allergies  Allergen Reactions  . Ciprofloxacin Other (See Comments)    Can't remember.  . Codeine     Can't remember   . Levofloxacin     Can't remember  . Niaspan [Niacin] Other (See Comments)    intolerant  . Sulfa Antibiotics   . Vytorin [Ezetimibe-Simvastatin] Other (See Comments)    myalgias     Physical Exam:   Vitals  Blood pressure 123/67, pulse 82, temperature 100 F (37.8 C), temperature source Rectal, resp. rate 13, height 5\' 3"  (1.6 m), weight 56.7 kg (125 lb), SpO2 95 %.   Gen.: Elderly female lying in bed not in distress, flat affect HEENT: No pallor, dry mucosa, temporal wasting, supple neck Chest: Bruising over left anterior clavicle and upper chest, clear to auscultation bilaterally CVS: Normal S1 and S2, no murmurs rub or gallop GI: Soft, nondistended, nontender, bowel sounds present, no muscular skeletal: Warm, no edema CNS: Alert and awake, oriented to place ( knows she's in [place],but then does not clearly recognize her husband and her son   Data Review:    CBC  Recent Labs Lab 02/21/16 1145  WBC 8.0  HGB 13.5  HCT 40.8  PLT  233  MCV 96.0  MCH 31.8  MCHC 33.1  RDW 12.6  LYMPHSABS 1.3  MONOABS 1.0  EOSABS 0.2  BASOSABS 0.0   ------------------------------------------------------------------------------------------------------------------  Chemistries   Recent Labs Lab  02/21/16 1145  NA 141  K 3.4*  CL 103  CO2 29  GLUCOSE 109*  BUN 20  CREATININE 0.73  CALCIUM 8.5*   ------------------------------------------------------------------------------------------------------------------ estimated creatinine clearance is 48.7 mL/min (by C-G formula based on SCr of 0.73 mg/dL). ------------------------------------------------------------------------------------------------------------------ No results for input(s): TSH, T4TOTAL, T3FREE, THYROIDAB in the last 72 hours.  Invalid input(s): FREET3  Coagulation profile No results for input(s): INR, PROTIME in the last 168 hours. ------------------------------------------------------------------------------------------------------------------- No results for input(s): DDIMER in the last 72 hours. -------------------------------------------------------------------------------------------------------------------  Cardiac Enzymes No results for input(s): CKMB, TROPONINI, MYOGLOBIN in the last 168 hours.  Invalid input(s): CK ------------------------------------------------------------------------------------------------------------------ No results found for: BNP   ---------------------------------------------------------------------------------------------------------------  Urinalysis    Component Value Date/Time   COLORURINE AMBER (A) 02/21/2016 1052   APPEARANCEUR CLEAR 02/21/2016 1052   LABSPEC 1.026 02/21/2016 1052   PHURINE 5.0 02/21/2016 1052   GLUCOSEU NEGATIVE 02/21/2016 1052   HGBUR NEGATIVE 02/21/2016 1052   BILIRUBINUR NEGATIVE 02/21/2016 1052   KETONESUR NEGATIVE 02/21/2016 1052   PROTEINUR NEGATIVE 02/21/2016 1052   UROBILINOGEN  0.2 11/04/2008 0929   NITRITE NEGATIVE 02/21/2016 1052   LEUKOCYTESUR NEGATIVE 02/21/2016 1052    ----------------------------------------------------------------------------------------------------------------   Imaging Results:    Dg Chest 2 View  Result Date: 02/21/2016 CLINICAL DATA:  Fall 2 days ago.  Bruising of the left shoulder. EXAM: CHEST  2 VIEW COMPARISON:  Left shoulder radiographs from the same day. FINDINGS: The heart size is normal. The lungs are clear. There is no edema or effusion. A displaced distal left clavicle fracture is again noted. IMPRESSION: 1. Displaced distal left clavicle fracture. 2. No acute cardiopulmonary disease. Electronically Signed   By: Marin Robertshristopher  Mattern M.D.   On: 02/21/2016 11:48   Dg Shoulder Left  Result Date: 02/21/2016 CLINICAL DATA:  Weakness, fall 2 days ago, left shoulder bruising EXAM: LEFT SHOULDER - 2+ VIEW COMPARISON:  None. FINDINGS: Two views of the left shoulder submitted. There is displaced fracture of distal left clavicle. No shoulder dislocation. IMPRESSION: Displaced fracture of distal left clavicle. No shoulder dislocation. Electronically Signed   By: Natasha MeadLiviu  Pop M.D.   On: 02/21/2016 11:38    My personal review of EKG: none   Assessment & Plan:   Principal problem Adult failure to thrive and severe protein calorie malnutrition  Contributed by generalized weakness, dehydration, advanced dementia and recent fall. Monitor on observation on med-surg. PT and nutrition consult. May need placement for skilled nursing. Husband having extreme difficulty taking care of her at home is now poorly ambulatory and needing increasing level of care. -Start on IV fluids for hydration. Replenish potassium. -Pain control with when necessary IV Toradol.   Displaced left clavicle fracture Secondary to mechanical fall. Will apply sling to help with pain control. When necessary IV Toradol for pain.   Advanced dementia Suspected due to  Alzheimer's disease. Follows with Dr Peggye LeyAther. Family report this has progressed over the past 2 years, worsened in the past 1-2 months. She is fully dependent with her ADLs.Check TSH and B12. Continue Namenda.   Hx  Of kidney stones  follows with Dr Janifer Adieahlstead   DVT Prophylaxis   Lovenox -   AM Labs Ordered, also please review Full Orders  Family Communication: Admission, patients condition and plan of care including tests being ordered have been discussed with patient's husband and son at bedside.  Code Status DO NOT RESUSCITATE (after discussing with patient's husband and son at bedside)  Likely DC to  SNF  Condition GUARDED  Consults called: None  Admission status: Observation   Time spent in minutes : 50   Eddie North M.D on 02/21/2016 at 5:01 PM  Between 7am to 7pm - Pager - [phone]. After 7pm go to [email or web address] - password [facility]  [facility] - Office  [phone]

## 2016-02-22 DIAGNOSIS — R531 Weakness: Secondary | ICD-10-CM

## 2016-02-22 LAB — BASIC METABOLIC PANEL
Anion gap: 7 (ref 5–15)
BUN: 13 mg/dL (ref 6–20)
CALCIUM: 7.8 mg/dL — AB (ref 8.9–10.3)
CO2: 23 mmol/L (ref 22–32)
CREATININE: 0.52 mg/dL (ref 0.44–1.00)
Chloride: 110 mmol/L (ref 101–111)
GFR calc Af Amer: >60 mL/min (ref >60)
Glucose, Bld: 100 mg/dL — ABNORMAL HIGH (ref 65–99)
Potassium: 3.9 mmol/L (ref 3.5–5.1)
SODIUM: 140 mmol/L (ref 135–145)

## 2016-02-22 LAB — VITAMIN B12: VITAMIN B 12: 250 pg/mL (ref 180–914)

## 2016-02-22 MED ORDER — POTASSIUM CHLORIDE IN NACL 20-0.9 MEQ/L-% IV SOLN
INTRAVENOUS | Status: DC
  Administered 2016-02-22 – 2016-02-23 (×2): via INTRAVENOUS
  Filled 2016-02-22 (×3): qty 1000

## 2016-02-22 NOTE — Evaluation (Signed)
Physical Therapy Evaluation Patient Details Name: Hannah Soto MRN: [id number] DOB: 07/01/1940 Today's Date: 02/22/2016   History of Present Illness  Hannah Soto  is a 76 y.o. female,History of advanced dementia, hyperlipidemia who is usually homebound and needs assistance with all her ADLs was brought to the ED 02/21/15 by her husband due to progressive weakness, fall with Left clavicle fracture, requiring significant assistance , worsening confusion.  Clinical Impression  The patient  Was assisted by 2 persons to sit  At the bed edge, posterior pushing, legs and trunk rigid. Gradually relaxed and sat with mod assistance and drank from a cup. The patient does not follow any verbal commands. Continue trial ofPT efforts for mobility. Per spouse, patient has no ambulated in many days. Pt admitted with above diagnosis. Pt currently with functional limitations due to the deficits listed below (see PT Problem List).  Pt will benefit from skilled PT to increase their independence and safety with mobility to allow discharge to the venue listed below.       Follow Up Recommendations SNF (PT may be limited due to significant dementia. )    Equipment Recommendations  None recommended by PT    Recommendations for Other Services       Precautions / Restrictions Precautions Precautions: Fall Required Braces or Orthoses: Sling Restrictions Other Position/Activity Restrictions: sling left arm      Mobility  Bed Mobility Overal bed mobility: Needs Assistance Bed Mobility: Supine to Sit;Sit to Supine     Supine to sit: Total assist;+2 for physical assistance;+2 for safety/equipment;HOB elevated Sit to supine: Total assist;+2 for physical assistance;+2 for safety/equipment   General bed mobility comments: use of bed pad, patient offers no assistance. patient pushes back and legs are extended. Gradually able to assume more flexed posture  while sitting on the bed edge, facilitation to flex at hips  and knees. Patient did gain some balance with mod assistance, took  a cup and drank juice.  Total assistance to return to supine.   Transfers                 General transfer comment: unable as patient is retropulsive with attempts.  Ambulation/Gait                Stairs            Wheelchair Mobility    Modified Rankin (Stroke Patients Only)       Balance Overall balance assessment: History of Falls;Needs assistance Sitting-balance support: Feet supported;Single extremity supported Sitting balance-Leahy Scale: Zero                                       Pertinent Vitals/Pain Pain Assessment: Faces Faces Pain Scale: Hurts little more Pain Location: left arm Pain Intervention(s): Repositioned;Monitored during session    Home Living Family/patient expects to be discharged to:: Private residence Living Arrangements: Spouse/significant other Available Help at Discharge: Family         Home Layout: Two level;Bed/bath upstairs        Prior Function Level of Independence: Needs assistance   Gait / Transfers Assistance Needed: per spouse has been unable to ambulate since  last week.  ADL's / Homemaking Assistance Needed: incontinence, wears depends        Hand Dominance        Extremity/Trunk Assessment   Upper Extremity Assessment Upper Extremity Assessment: LUE deficits/detail LUE Deficits /  Details: sling in place, repositioned  arm as patient is trying to use it.    Lower Extremity Assessment Lower Extremity Assessment: LLE deficits/detail;RLE deficits/detail RLE Deficits / Details: holds the legs rigid, LLE Deficits / Details: rigid, gradual flexed knees and hips after sitting       Communication      Cognition Arousal/Alertness: Awake/alert Behavior During Therapy: Restless Overall Cognitive Status: History of cognitive impairments - at baseline Area of Impairment: Following commands;Awareness                General Comments: does not follow verbal, automatic to take cup and take a drink, otherwise, patient does not follow commands. keeps eyes closed majority of the time    General Comments      Exercises     Assessment/Plan    PT Assessment Patient needs continued PT services (trial as patient may not be able to participate)  PT Problem List Decreased range of motion;Decreased strength;Decreased activity tolerance;Decreased balance;Decreased mobility;Decreased cognition          PT Treatment Interventions Gait training;Functional mobility training;Therapeutic activities;Balance training;Patient/family education    PT Goals (Current goals can be found in the Care Plan section)  Acute Rehab PT Goals Patient Stated Goal: spouse hopes that patient can mobilize PT Goal Formulation: With family Time For Goal Achievement: 03/07/16 Potential to Achieve Goals: Fair    Frequency Min 2X/week   Barriers to discharge        Co-evaluation               End of Session   Activity Tolerance: Patient tolerated treatment well Patient left: in bed;with call bell/phone within reach;with bed alarm set;with nursing/sitter in room;with family/visitor present Nurse Communication: Mobility status    Functional Assessment Tool Used: clinical judgement Functional Limitation: Mobility: Walking and moving around Mobility: Walking and Moving Around Current Status (Z6109): 100 percent impaired, limited or restricted Mobility: Walking and Moving Around Goal Status [id number]): At least 20 percent but less than 40 percent impaired, limited or restricted    Time: 0925-0947 PT Time Calculation (min) (ACUTE ONLY): 22 min   Charges:   PT Evaluation $PT Eval Low Complexity: 1 Procedure     PT G Codes:   PT G-Codes **NOT FOR INPATIENT CLASS** Functional Assessment Tool Used: clinical judgement Functional Limitation: Mobility: Walking and moving around Mobility: Walking and Moving Around Current  Status (U9811): 100 percent impaired, limited or restricted Mobility: Walking and Moving Around Goal Status (B1478): At least 20 percent but less than 40 percent impaired, limited or restricted    Rada Hay 02/22/2016, 10:03 AM Blanchard Kelch PT [phone]

## 2016-02-22 NOTE — Progress Notes (Signed)
Initial Nutrition Assessment  INTERVENTION:   Will continue to monitor for nutrition needs  NUTRITION DIAGNOSIS:   Unintentional weight loss related to other (see comment) (advanced dementia) as evidenced by percent weight loss.  GOAL:   Patient will meet greater than or equal to 90% of their needs  MONITOR:   PO intake, Labs, Weight trends, I & O's  REASON FOR ASSESSMENT:   Consult Assessment of nutrition requirement/status  ASSESSMENT:   76 y.o. female,History of advanced dementia, hyperlipidemia who is usually homebound and needs assistance with all her ADLs was brought to the ED by her husband due to progressive weakness for the past 2 weeks. Until 2 months back patient was able to walk almost half a mile in the community. Gradually she started becoming weak and for the past 2 weeks she has been increasingly weak needing significant assistance even to take a few steps. Her bedroom is on the second floor and patient's husband is having a lot of difficulty to help her take the stairs. Her appetite has also decreased. One week back patient fell from the chair while being seated during her meal and landed on her left shoulder. She developed a bruise around it. Husband and son have noticed that patient is increasingly confused as well. (Has significant dementia at baseline).  Patient in room with no family at bedside. Pt unable to provide any history d/t advanced dementia. Spoke with pt's husband on the phone who reports pt has had a good appetite and eats well if assisted. He denies the patient has had issues with chewing or swallowing. Per chart review, pt has been consuming 50-75% of meals. Pt does not use protein supplements at home.   Per chart review, pt has lost 8 lb since 11/13 (6% wt loss x 2 months, significant for time frame). Noticeable depletion by visual assessment. Suspect any depletion is associated with advanced age.  Medications reviewed. Labs reviewed: Vitamin B12  WNL  Diet Order:  Diet regular Room service appropriate? Yes; Fluid consistency: Thin  Skin:  Reviewed, no issues  Last BM:  1/7  Height:   Ht Readings from Last 1 Encounters:  02/21/16 5\' 4"  (1.626 m)    Weight:   Wt Readings from Last 1 Encounters:  02/21/16 117 lb 4.6 oz (53.2 kg)    Ideal Body Weight:  54.5 kg  BMI:  Body mass index is 20.13 kg/m.  Estimated Nutritional Needs:   Kcal:  1350-1550  Protein:  55-65g  Fluid:  1.5L/day  EDUCATION NEEDS:   No education needs identified at this time  Tilda FrancoLindsey Anitria Andon, MS, RD, LDN Pager: [phone] After Hours Pager: [phone]

## 2016-02-22 NOTE — Clinical Social Work Note (Signed)
Clinical Social Work Assessment  Patient Details  Name: Hannah Soto MRN: [id number] Date of Birth: 01/14/1941  Date of referral:  02/22/16               Reason for consult:  Discharge Planning                Permission sought to share information with:  Oceanographeracility Contact Representative Permission granted to share information::  Yes, Verbal Permission Granted  Name::        Agency::     Relationship::     Contact Information:     Housing/Transportation Living arrangements for the past 2 months:  Single Family Home Source of Information:  Spouse Patient Interpreter Needed:  None Criminal Activity/Legal Involvement Pertinent to Current Situation/Hospitalization:  No - Comment as needed Significant Relationships:  Adult Children, Spouse Lives with:  Spouse Do you feel safe going back to the place where you live?  Yes Need for family participation in patient care:  Yes (Comment)  Care giving concerns:  Spouse may not be able to provide needed care at home any longer.   Social Worker assessment / plan:  Pt hospitalized on 02/21/16 from home with adult failure to thrive. Pt is severely demented and unable to participate in d/c planning. PT has recommended SNF placement at d/c. CSW review recommendations with spouse at bedside. Spouse is willing to consider SNF placement but would like his daughter to assist with d/c planning as well. Daughter will be visiting hospital tomorrow and CSW will meet again with pt / family at that time. SNF search has been initiated and bed offers pending.  Employment status:  Retired Database administratornsurance information:  Managed Medicare PT Recommendations:  Skilled Nursing Facility Information / Referral to community resources:  Skilled Nursing Facility  Patient/Family's Response to care:  Disposition to be determined  Patient/Family's Understanding of and Emotional Response to Diagnosis, Current Treatment, and Prognosis:  Spouse is aware of pt's medical status. He is  considering SNF placement and appreciates CSW assistance with d/c planning.  Emotional Assessment Appearance:  Appears stated age Attitude/Demeanor/Rapport:  Other (cooperative) Affect (typically observed):  Calm Orientation:   (disoriented x 4) Alcohol / Substance use:  Not Applicable Psych involvement (Current and /or in the community):  No (Comment)  Discharge Needs  Concerns to be addressed:  Discharge Planning Concerns Readmission within the last 30 days:  No Current discharge risk:  None Barriers to Discharge:  No Barriers Identified   Royetta AsalHaidinger, Tammy Wickliffe Lee, LCSW  [phone] 02/22/2016, 1:52 PM

## 2016-02-22 NOTE — Care Management Obs Status (Signed)
MEDICARE OBSERVATION STATUS NOTIFICATION   Patient Details  Name: Hannah Soto MRN: [id number] Date of Birth: 02/18/1940   Medicare Observation Status Notification Given:  Yes Signed by husband as pt has dementia.   Bartholome BillCLEMENTS, Connery Shiffler H, RN 02/22/2016, 1:33 PM

## 2016-02-22 NOTE — NC FL2 (Signed)
[place] MEDICAID FL2 LEVEL OF CARE SCREENING TOOL     IDENTIFICATION  Patient Name: Hannah Soto Birthdate: 04/11/1940 Sex: female Admission Date (Current Location): 02/21/2016  [facility] [place]   Fac[facility] and Address[facility]  [facility],  [address], [place] [id number]      Provider Number: [id number]  Attending Physician Name and Address:  Tyrone Nineyan B Grunz, MD  Relative Name and Phone Number:       Current Level of Care: Hospital Recommended Level of Care: Skilled Nursing Facility Prior Approval Number:    Date Approved/Denied:   PASRR Number: [id number]  Discharge Plan: SNF    Current Diagnoses: Patient Active Problem List   Diagnosis Date Noted  . Failure to thrive in adult 02/21/2016  . Displaced fracture of lateral end of left clavicle, initial encounter for closed fracture 02/21/2016  . Hypokalemia 02/21/2016  . Dehydration 02/21/2016  . Protein-calorie malnutrition (HCC) 02/21/2016  . Severe dementia 02/21/2016  . Essential hypertension 05/02/2013  . Mixed hyperlipidemia   . Memory loss 06/11/2012    Orientation RESPIRATION BLADDER Height & Weight      (disoriented x 4)  Normal Incontinent Weight: 117 lb 4.6 oz (53.2 kg) Height:  5\' 4"  (162.6 cm)  BEHAVIORAL SYMPTOMS/MOOD NEUROLOGICAL BOWEL NUTRITION STATUS  Other (Comment) (no behaviors)   Continent Diet  AMBULATORY STATUS COMMUNICATION OF NEEDS Skin   Extensive Assist Verbally Normal                       Personal Care Assistance Level of Assistance  Bathing, Feeding, Dressing Bathing Assistance: Maximum assistance Feeding assistance: Limited assistance Dressing Assistance: Maximum assistance     Functional Limitations Info  Sight, Hearing, Speech Sight Info: Adequate Hearing Info: Adequate Speech Info: Adequate    SPECIAL CARE FACTORS FREQUENCY  OT (By licensed OT), PT (By licensed PT)     PT Frequency: 5x wk OT Frequency: 5x wk             Contractures Contractures Info: Not present    Additional Factors Info  Code Status Code Status Info: DNR             Current Medications (02/22/2016):  This is the current hospital active medication list Current Facility-Administered Medications  Medication Dose Route Frequency Provider Last Rate Last Dose  . 0.9 % NaCl with KCl 20 mEq/ L  infusion   Intravenous Continuous Tyrone Nineyan B Grunz, MD      . acetaminophen (TYLENOL) tablet 650 mg  650 mg Oral Q6H PRN Nishant Dhungel, MD       Or  . acetaminophen (TYLENOL) suppository 650 mg  650 mg Rectal Q6H PRN Nishant Dhungel, MD      . aspirin EC tablet 81 mg  81 mg Oral Daily Nishant Dhungel, MD   81 mg at 02/22/16 1059  . donepezil (ARICEPT) tablet 10 mg  10 mg Oral QHS Nishant Dhungel, MD   10 mg at 02/21/16 2317  . enoxaparin (LOVENOX) injection 40 mg  40 mg Subcutaneous Q24H Nishant Dhungel, MD   40 mg at 02/21/16 2317  . ezetimibe (ZETIA) tablet 10 mg  10 mg Oral Daily Nishant Dhungel, MD   10 mg at 02/22/16 1058  . ketorolac (TORADOL) 15 MG/ML injection 15 mg  15 mg Intravenous Q6H PRN Nishant Dhungel, MD      . ondansetron (ZOFRAN) tablet 4 mg  4 mg Oral Q6H PRN Eddie NorthNishant Dhungel, MD  Or  . ondansetron (ZOFRAN) injection 4 mg  4 mg Intravenous Q6H PRN Nishant Dhungel, MD      . ondansetron (ZOFRAN-ODT) disintegrating tablet 4 mg  4 mg Oral Q8H PRN Nishant Dhungel, MD      . senna-docusate (Senokot-S) tablet 1 tablet  1 tablet Oral QHS PRN Nishant Dhungel, MD         Discharge Medications: Please see discharge summary for a list of discharge medications.  Relevant Imaging Results:  Relevant Lab Results:   Additional Information SS #   Marsden Zaino, Dickey Gave, LCSW

## 2016-02-22 NOTE — Progress Notes (Signed)
PROGRESS NOTE  Sandria BalesBetty J Llewellyn  WUJ:[id number] DOB: 02/10/1941 DOA: 02/21/2016 PCP: Gaye AlkenBARNES,ELIZABETH STEWART, MD   Brief Narrative:  Hannah Soto is a 76 y.o. female with a history of advanced dementia, hyperlipidemia who is usually homebound and needs assistance with all her ADLs who was brought to the ED by her husband due to progressive weakness for the past 2 weeks. Until 2 months back patient was able to walk almost half a mile in the community. Gradually she started becoming weak and for the past 2 weeks she has been increasingly weak needing significant assistance even to take a few steps. Her bedroom is on the second floor and patient's husband is having a lot of difficulty to help her take the stairs. Her appetite has also decreased. One week back patient fell from the chair while being seated during her meal and landed on her left shoulder. She developed a bruise around it. Husband and son have noticed that patient is increasingly confused as well. (Has significant dementia at baseline). Husband denies any fever, chills, nausea, vomiting, chest pain, shortness of breath, abdominal pain, diarrhea, hematuria or foul-smelling urine. Denies head injury sustained during the fall. No recent illness, sick contacts or travel. No new medications. Husband reports extreme difficulty taking care of her at home. On arrival  in ED, patient had a temperature of 100 F, heart rate of 100, stable blood pressure. Initial oxygen was 84% improved to 2 L via nasal cannula. Lab work showed normal CBC. Chemistry showed potassium of 3.4, glucose of 109 and normal lactic acid. Chest x-ray was unremarkable except for displaced distal left clavicle. UA was negative for infection. Hospitalist consulted for observation for progressive failure to thrive and PT evaluation. Oxygen requirement did not persist and the patient remains stable awaiting disposition to SNF.  Assessment & Plan: Active Problems:   Memory loss   Mixed  hyperlipidemia   Essential hypertension   Failure to thrive in adult   Displaced fracture of lateral end of left clavicle, initial encounter for closed fracture   Hypokalemia   Dehydration   Protein-calorie malnutrition (HCC)   Severe dementia  Adult failure to thrive and severe protein calorie malnutrition: Due to generalized weakness, dehydration, advanced dementia and recent fall. - PT/OT/nutrition consults appreciated. - Taking only marginal po. Will continue IVF's  Displaced distal left clavicle fracture: Secondary to mechanical fall.  - Apply and maintain sling. - Continue toradol IV prn pain.   Advanced dementia: Suspected due to Alzheimer's disease. Follows with Dr Peggye LeyAther. Progressed over the past 2 years, worsened in the past 1-2 months. She is fully dependent with her ADLs. - TSH and B12 wnl.  - Continue Namenda.  Hx kidney stones: No symptoms. Follows with Dr Janifer Adieahlstead.  DVT prophylaxis: Lovenox Code Status: DNR Family Communication: Husband at bedside Disposition Plan: Anticipate SNF discharge in 24-48 hours.  Consultants:   None  Procedures:   None  Antimicrobials:  None   Subjective: Pt confused. Without complaints. Denies pain. Husband reports she's at baseline, moving left shoulder some.  Objective: Vitals:   02/21/16 2026 02/22/16 0444 02/22/16 1000 02/22/16 1430  BP: 100/78 140/77 133/69 128/78  Pulse: 85 80 92 85  Resp: 16 18 16 18   Temp: 97.9 F (36.6 C) 98 F (36.7 C) 97.5 F (36.4 C) 98.2 F (36.8 C)  TempSrc: Axillary Axillary Axillary Axillary  SpO2: 99% 96% 96% 96%  Weight:      Height:        Intake/Output Summary (  Last 24 hours) at 02/22/16 2004 Last data filed at 02/22/16 1700  Gross per 24 hour  Intake          2513.33 ml  Output                0 ml  Net          2513.33 ml   Filed Weights   02/21/16 1022 02/21/16 1755  Weight: 56.7 kg (125 lb) 53.2 kg (117 lb 4.6 oz)    Examination: General exam: 76 y.o. female  in no distress Respiratory system: Non-labored breathing room air. Clear to auscultation bilaterally.  Cardiovascular system: Regular rate and rhythm. No murmur, rub, or gallop. No JVD, and no pedal edema. Gastrointestinal system: Abdomen soft, non-tender, non-distended, with normoactive bowel sounds. No organomegaly or masses felt. Central nervous system: Alert and oriented. No focal neurological deficits. Extremities: Warm, no skin tenting of left shoulder. Left arm in sling. Skin: Significant ecchymosis on left shoulder.  Psychiatry: Judgement and insight appear normal. Mood & affect appropriate.   Data Reviewed: I have personally reviewed following labs and imaging studies  CBC:  Recent Labs Lab 02/21/16 1145 02/21/16 1853  WBC 8.0 6.0  NEUTROABS 5.5  --   HGB 13.5 14.5  HCT 40.8 42.3  MCV 96.0 95.9  PLT 233 193   Basic Metabolic Panel:  Recent Labs Lab 02/21/16 1145 02/21/16 1853 02/22/16 0434  NA 141  --  140  K 3.4*  --  3.9  CL 103  --  110  CO2 29  --  23  GLUCOSE 109*  --  100*  BUN 20  --  13  CREATININE 0.73 0.65 0.52  CALCIUM 8.5*  --  7.8*   GFR: Estimated Creatinine Clearance: 49.5 mL/min (by C-G formula based on SCr of 0.52 mg/dL). Liver Function Tests: No results for input(s): AST, ALT, ALKPHOS, BILITOT, PROT, ALBUMIN in the last 168 hours. No results for input(s): LIPASE, AMYLASE in the last 168 hours. No results for input(s): AMMONIA in the last 168 hours. Coagulation Profile: No results for input(s): INR, PROTIME in the last 168 hours. Cardiac Enzymes: No results for input(s): CKTOTAL, CKMB, CKMBINDEX, TROPONINI in the last 168 hours. BNP (last 3 results) No results for input(s): PROBNP in the last 8760 hours. HbA1C: No results for input(s): HGBA1C in the last 72 hours. CBG: No results for input(s): GLUCAP in the last 168 hours. Lipid Profile: No results for input(s): CHOL, HDL, LDLCALC, TRIG, CHOLHDL, LDLDIRECT in the last 72  hours. Thyroid Function Tests:  Recent Labs  02/21/16 1853  TSH 0.969   Anemia Panel:  Recent Labs  02/21/16 1853  VITAMINB12 250   Urine analysis:    Component Value Date/Time   COLORURINE AMBER (A) 02/21/2016 1052   APPEARANCEUR CLEAR 02/21/2016 1052   LABSPEC 1.026 02/21/2016 1052   PHURINE 5.0 02/21/2016 1052   GLUCOSEU NEGATIVE 02/21/2016 1052   HGBUR NEGATIVE 02/21/2016 1052   BILIRUBINUR NEGATIVE 02/21/2016 1052   KETONESUR NEGATIVE 02/21/2016 1052   PROTEINUR NEGATIVE 02/21/2016 1052   UROBILINOGEN 0.2 11/04/2008 0929   NITRITE NEGATIVE 02/21/2016 1052   LEUKOCYTESUR NEGATIVE 02/21/2016 1052   Sepsis Labs: @LABRCNTIP (procalcitonin:4,lacticidven:4)  )No results found for this or any previous visit (from the past 240 hour(s)).   Radiology Studies: Dg Chest 2 View  Result Date: 02/21/2016 CLINICAL DATA:  Fall 2 days ago.  Bruising of the left shoulder. EXAM: CHEST  2 VIEW COMPARISON:  Left shoulder radiographs from the  same day. FINDINGS: The heart size is normal. The lungs are clear. There is no edema or effusion. A displaced distal left clavicle fracture is again noted. IMPRESSION: 1. Displaced distal left clavicle fracture. 2. No acute cardiopulmonary disease. Electronically Signed   By: Marin Roberts M.D.   On: 02/21/2016 11:48   Dg Shoulder Left  Result Date: 02/21/2016 CLINICAL DATA:  Weakness, fall 2 days ago, left shoulder bruising EXAM: LEFT SHOULDER - 2+ VIEW COMPARISON:  None. FINDINGS: Two views of the left shoulder submitted. There is displaced fracture of distal left clavicle. No shoulder dislocation. IMPRESSION: Displaced fracture of distal left clavicle. No shoulder dislocation. Electronically Signed   By: Natasha Mead M.D.   On: 02/21/2016 11:38    Scheduled Meds: . aspirin EC  81 mg Oral Daily  . donepezil  10 mg Oral QHS  . enoxaparin (LOVENOX) injection  40 mg Subcutaneous Q24H  . ezetimibe  10 mg Oral Daily   Continuous Infusions: .  0.9 % NaCl with KCl 20 mEq / L 75 mL/hr at 02/22/16 1529     LOS: 0 days   Time spent: 25 minutes.  Hazeline Junker, MD [facility] Pager [phone]  If 7PM-7AM, please contact night-coverage [email or web address] Password [email or web address] 02/22/2016, 8:04 PM

## 2016-02-22 NOTE — Progress Notes (Signed)
CSW received call from patients daughter, Larita FifeLynn, requesting next steps for discharge. CSW informed patient of the SNF beds that had excepted patient. Ms. Larita FifeLynn stated she would visit facilities in the morning to confirm placement however [facility]   Stacy GardnerErin Cordale Manera, LCSWA Clinical Social Worker [phone]

## 2016-02-22 NOTE — Clinical Social Work Placement (Signed)
   CLINICAL SOCIAL WORK PLACEMENT  NOTE  Date:  02/22/2016  Patient Details  Name: Sandria BalesBetty J Learn MRN: [id number] Date of Birth: 05/12/1940  Clinical Social Work is seeking post-discharge placement for this patient at the Skilled  Nursing Facility level of care (*CSW will initial, date and re-position this form in  chart as items are completed):  Yes   Patient/family provided with [facility] Clinical Social Work Department's list of facilities offering this level of care within the geographic area requested by the patient (or if unable, by the patient's family).  Yes   Patient/family informed of their freedom to choose among providers that offer the needed level of care, that participate in Medicare, Medicaid or managed care program needed by the patient, have an available bed and are willing to accept the patient.  Yes   Patient/family informed of [facility]'s ownership interest in [facility] and [facility], as well as of the fact that they are under no obligation to receive care at these facilities.  PASRR submitted to EDS on 02/22/16     PASRR number received on 02/22/16     Existing PASRR number confirmed on       FL2 transmitted to all facilities in geographic area requested by pt/family on 02/22/16     FL2 transmitted to all facilities within larger geographic area on       Patient informed that his/her managed care company has contracts with or will negotiate with certain facilities, including the following:            Patient/family informed of bed offers received.  Patient chooses bed at       Physician recommends and patient chooses bed at      Patient to be transferred to   on  .  Patient to be transferred to facility by       Patient family notified on   of transfer.  Name of family member notified:        PHYSICIAN       Additional Comment:    _______________________________________________ Royetta AsalHaidinger, Deshawn Skelley Lee, LCSW  [phone] 02/22/2016, 1:58  PM

## 2016-02-23 DIAGNOSIS — R531 Weakness: Secondary | ICD-10-CM | POA: Diagnosis not present

## 2016-02-23 MED ORDER — HYDROCODONE-ACETAMINOPHEN 5-325 MG PO TABS: 1.0000 | ORAL_TABLET | Freq: Four times a day (QID) | ORAL | 0 refills | Status: DC | PRN

## 2016-02-23 MED ORDER — SENNOSIDES-DOCUSATE SODIUM 8.6-50 MG PO TABS: 1.0000 | ORAL_TABLET | Freq: Every evening | ORAL | Status: DC | PRN

## 2016-02-23 MED ORDER — ACETAMINOPHEN 325 MG PO TABS: 650.0000 mg | ORAL_TABLET | Freq: Four times a day (QID) | ORAL | Status: DC | PRN

## 2016-02-23 NOTE — Progress Notes (Signed)
I have made 3 attempts to call report to [facility] with no answer and the mail box is full @ [phone]. I have called the main number to verify the number. This is the  correct number.

## 2016-02-23 NOTE — Clinical Social Work Placement (Addendum)
   CLINICAL SOCIAL WORK PLACEMENT  NOTE  Date:  02/23/2016  Patient Details  Name: Hannah Soto MRN: [id number] Date of Birth: 02/05/1941  Clinical Social Work is seeking post-discharge placement for this patient at the Skilled  Nursing Facility level of care (*CSW will initial, date and re-position this form in  chart as items are completed):  Yes   Patient/family provided with [facility] Clinical Social Work Department's list of facilities offering this level of care within the geographic area requested by the patient (or if unable, by the patient's family).  Yes   Patient/family informed of their freedom to choose among providers that offer the needed level of care, that participate in Medicare, Medicaid or managed care program needed by the patient, have an available bed and are willing to accept the patient.  Yes   Patient/family informed of [name]'s ownership interest in [facility] and [facility], as well as of the fact that they are under no obligation to receive care at these facilities.  PASRR submitted to EDS on 02/22/16     PASRR number received on 02/22/16     Existing PASRR number confirmed on       FL2 transmitted to all facilities in geographic area requested by pt/family on 02/22/16     FL2 transmitted to all facilities within larger geographic area on       Patient informed that his/her managed care company has contracts with or will negotiate with certain facilities, including the following:        Yes   Patient/family informed of bed offers received.  [name]     Physician recommends and patient chooses bed at      [name].  Patient to be transferred to facility by [facility]     Patient family notified on 02/23/16 of transfer.  Name of family member notified:  Spouse     PHYSICIAN       Additional Comment: Pt / family are in agreement with d/c to [name] today.  [facility] transport is required. Medical necessity form completed. Family is aware out of pocket costs may be associated with [facility] transport. D/C Summary sent to SNF for review. Scripts included in d/c packet. # for report provided to nsg.   _______________________________________________ Royetta AsalHaidinger, Roarke Marciano Lee, LCSW  [phone] 02/23/2016, 3:11 PM

## 2016-02-23 NOTE — Progress Notes (Signed)
OT Cancellation Note  Patient Details Name: Hannah Soto MRN: [id number] DOB: 07/28/1940   Cancelled Treatment:    Reason Eval/Treat Not Completed: Other (comment).  Pt is going to SNF today; will defer OT evaluation to that venue.  Keyaira Clapham 02/23/2016, 10:14 AM  Marica OtterMaryellen Dustine Bertini, OTR/L [phone] 02/23/2016

## 2016-02-23 NOTE — Progress Notes (Signed)
2 more attempts made to call report with no answer and mail box is full

## 2016-02-23 NOTE — Discharge Summary (Signed)
Physician Discharge Summary  Hannah Soto ZOX:[id number] DOB: November 26, 1940 DOA: 02/21/2016  PCP: Gaye Alken, MD  Admit date: 02/21/2016 Discharge date: 02/23/2016  Admitted From: Home Disposition: [name]   Recommendations for Outpatient Follow-up:  1. Follow up with orthopedics for left distal clavicle fracture 2. Consider palliative care services as patient appears to be in end-stage dementia.  Home Health: N/A Equipment/Devices: None Discharge Condition: Stable for transfer CODE STATUS: DNR Diet recommendation: Regular  Brief/Interim Summary: BettyCarteris a 76 y.o.female with a history of advanced dementia, hyperlipidemia who is usually homebound and needs assistance with all her ADLs who was brought to the ED by her husband due to progressive weakness for the past 2 weeks. Until 2 months back patient was able to walk almost half a mile in the community. Gradually she started becoming weak and for the past 2 weeks she has been increasingly weak needing significant assistance even to take a few steps. Her bedroom is on the second floor and patient's husband is having a lot of difficulty to help her take the stairs. Her appetite has also decreased. 1 week PTA, she fell from the chair while being seated during her meal and landed on her left shoulder. She developed a bruise around it. Husband and son have noticed that patient is increasingly confused as well. (Has significant dementia at baseline). Husband denies any fever, chills, nausea, vomiting, chest pain, shortness of breath, abdominal pain, diarrhea, hematuria or foul-smelling urine. Denies head injury sustained during the fall. No recent illness, sick contacts or travel. No new medications. Husband reports extreme difficulty taking care of her at home. On arrival  in ED, patient had a temperature of 100 F, heart rate of 100, stable blood pressure. Initial oxygen was 84% improved to 2 L via nasal cannula. Lab work  showed normal CBC. Chemistry showed potassium of 3.4, glucose of 109 and normal lactic acid. UA was negative for infection. Chest x-ray was unremarkable except for displaced distal left clavicle confirmed on shoulder film. Hospitalist consulted for observation for progressive failure to thrive and PT evaluation. Oxygen requirement did not persist and the patient remains stable. A left arm sling was applied and pain controlled with toradol. She is stable for transfer to SNF.  Discharge Diagnoses:  Active Problems:   Memory loss   Mixed hyperlipidemia   Essential hypertension   Failure to thrive in adult   Displaced fracture of lateral end of left clavicle, initial encounter for closed fracture   Hypokalemia   Dehydration   Protein-calorie malnutrition (HCC)   Severe dementia  Adult failure to thrive and severe protein calorie malnutrition: Due to generalized weakness, dehydration, advanced dementia and recent fall. - Continue to monitor at SNF - Taking fair po while inpatient  Displaced distal left clavicle fracture: Secondary to mechanical fall.  - Apply and maintain sling. 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 Follows with Dr Peggye Ley. Progressed over the past 2 years,worsened in the past 1-2 months. She is fully dependent with her ADLs. - TSH and B12 wnl.  - Continued aricept and namenda, though this may no longer be helpful.  Hx kidney stones: No symptoms.Follows with Dr Janifer Adie.  Hyperlipidemia:  - Consider discontinuing zetia  Discharge Instructions  Allergies as of 02/23/2016      Reactions   Ciprofloxacin Other (See Comments)   Can't remember.   Codeine    Can't remember  Levofloxacin    Can't remember   Niaspan [niacin] Other (See Comments)   intolerant   Sulfa Antibiotics    Vytorin [ezetimibe-simvastatin] Other (See Comments)   myalgias      Medication List     STOP taking these medications   cephALEXin 500 MG capsule Commonly known as:  KEFLEX     TAKE these medications   acetaminophen 500 MG tablet Commonly known as:  TYLENOL Take 500-1,000 mg by mouth every 6 (six) hours as needed for mild pain, moderate pain, fever or headache. What changed:  Another medication with the same name was added. Make sure you understand how and when to take each.   acetaminophen 325 MG tablet Commonly known as:  TYLENOL Take 2 tablets (650 mg total) by mouth every 6 (six) hours as needed for mild pain (or Fever >/= 101). What changed:  You were already taking a medication with the same name, and this prescription was added. Make sure you understand how and when to take each.   aspirin EC 81 MG tablet Take 81 mg by mouth daily.   donepezil 10 MG tablet Commonly known as:  ARICEPT TAKE ONE TABLET AT BEDTIME.   ezetimibe 10 MG tablet Commonly known as:  ZETIA Take 1 tablet (10 mg total) by mouth daily. PLEASE CONTACT OFFICE FOR ADDITIONAL REFILLS 2ND ATTEMPT   HYDROcodone-acetaminophen 5-325 MG tablet Commonly known as:  NORCO Take 1-2 tablets by mouth every 6 (six) hours as needed for severe pain. What changed:  reasons to take this   memantine 28 MG Cp24 24 hr capsule Commonly known as:  NAMENDA XR Take 1 capsule (28 mg total) by mouth daily.   ondansetron 4 MG disintegrating tablet Commonly known as:  ZOFRAN ODT Take 1 tablet (4 mg total) by mouth every 8 (eight) hours as needed for nausea or vomiting.   senna-docusate 8.6-50 MG tablet Commonly known as:  Senokot-S Take 1 tablet by mouth at bedtime as needed for mild constipation.      Follow-up Information    Gaye Alken, MD Follow up.   Specialty:  Family Medicine Contact information: [address] [place] [address] [phone]          Allergies  Allergen Reactions  . Ciprofloxacin Other (See Comments)    Can't remember.  . Codeine     Can't  remember   . Levofloxacin     Can't remember  . Niaspan [Niacin] Other (See Comments)    intolerant  . Sulfa Antibiotics   . Vytorin [Ezetimibe-Simvastatin] Other (See Comments)    myalgias    Consultations:  None  Procedures/Studies: Dg Chest 2 View  Result Date: 02/21/2016 CLINICAL DATA:  Fall 2 days ago.  Bruising of the left shoulder. EXAM: CHEST  2 VIEW COMPARISON:  Left shoulder radiographs from the same day. FINDINGS: The heart size is normal. The lungs are clear. There is no edema or effusion. A displaced distal left clavicle fracture is again noted. IMPRESSION: 1. Displaced distal left clavicle fracture. 2. No acute cardiopulmonary disease. Electronically Signed   By: Marin Roberts M.D.   On: 02/21/2016 11:48   Dg Shoulder Left  Result Date: 02/21/2016 CLINICAL DATA:  Weakness, fall 2 days ago, left shoulder bruising EXAM: LEFT SHOULDER - 2+ VIEW COMPARISON:  None. FINDINGS: Two views of the left shoulder submitted. There is displaced fracture of distal left clavicle. No shoulder dislocation. IMPRESSION: Displaced fracture of distal left clavicle. No shoulder dislocation. Electronically Signed  By: Natasha MeadLiviu  Pop M.D.   On: 02/21/2016 11:38    Subjective: Patient confused, minimally verbal. Denies pain. Son at bedside says she's at her recent baseline and has had no complaints for him. Ate breakfast.   Discharge Exam: Vitals:   02/22/16 2141 02/23/16 0616  BP: 137/76 (!) 140/92  Pulse: 66 85  Resp: 18 16  Temp: 97.8 F (36.6 C) 97.6 F (36.4 C)   Vitals:   02/22/16 1000 02/22/16 1430 02/22/16 2141 02/23/16 0616  BP: 133/69 128/78 137/76 (!) 140/92  Pulse: 92 85 66 85  Resp: 16 18 18 16   Temp: 97.5 F (36.4 C) 98.2 F (36.8 C) 97.8 F (36.6 C) 97.6 F (36.4 C)  TempSrc: Axillary Axillary Oral Oral  SpO2: 96% 96% 99% 99%  Weight:      Height:       General: Pt is alert, awake, not in acute distress Cardiovascular: RRR, S1/S2 +, no rubs, no  gallops Respiratory: CTA bilaterally, no wheezing, no rhonchi Abdominal: Soft, NT, ND, bowel sounds + Extremities: Left shoulder with palpable superior angulation of distal clavicle without skin tenting. Overlying ecchymosis evolving from day-to-day, not expanding.  Neuro: Diffusely weak and nonfocal. Rousable to voice and touch, does not follow commands. Not oriented.  The results of significant diagnostics from this hospitalization (including imaging, microbiology, ancillary and laboratory) are listed below for reference.    Microbiology: No results found for this or any previous visit (from the past 240 hour(s)).   Labs: BNP (last 3 results) No results for input(s): BNP in the last 8760 hours. Basic Metabolic Panel:  Recent Labs Lab 02/21/16 1145 02/21/16 1853 02/22/16 0434  NA 141  --  140  K 3.4*  --  3.9  CL 103  --  110  CO2 29  --  23  GLUCOSE 109*  --  100*  BUN 20  --  13  CREATININE 0.73 0.65 0.52  CALCIUM 8.5*  --  7.8*   Liver Function Tests: No results for input(s): AST, ALT, ALKPHOS, BILITOT, PROT, ALBUMIN in the last 168 hours. No results for input(s): LIPASE, AMYLASE in the last 168 hours. No results for input(s): AMMONIA in the last 168 hours. CBC:  Recent Labs Lab 02/21/16 1145 02/21/16 1853  WBC 8.0 6.0  NEUTROABS 5.5  --   HGB 13.5 14.5  HCT 40.8 42.3  MCV 96.0 95.9  PLT 233 193   Cardiac Enzymes: No results for input(s): CKTOTAL, CKMB, CKMBINDEX, TROPONINI in the last 168 hours. BNP: Invalid input(s): POCBNP CBG: No results for input(s): GLUCAP in the last 168 hours. D-Dimer No results for input(s): DDIMER in the last 72 hours. Hgb A1c No results for input(s): HGBA1C in the last 72 hours. Lipid Profile No results for input(s): CHOL, HDL, LDLCALC, TRIG, CHOLHDL, LDLDIRECT in the last 72 hours. Thyroid function studies  Recent Labs  02/21/16 1853  TSH 0.969   Anemia work up  Recent Labs  02/21/16 1853  VITAMINB12 250    Urinalysis    Component Value Date/Time   COLORURINE AMBER (A) 02/21/2016 1052   APPEARANCEUR CLEAR 02/21/2016 1052   LABSPEC 1.026 02/21/2016 1052   PHURINE 5.0 02/21/2016 1052   GLUCOSEU NEGATIVE 02/21/2016 1052   HGBUR NEGATIVE 02/21/2016 1052   BILIRUBINUR NEGATIVE 02/21/2016 1052   KETONESUR NEGATIVE 02/21/2016 1052   PROTEINUR NEGATIVE 02/21/2016 1052   UROBILINOGEN 0.2 11/04/2008 0929   NITRITE NEGATIVE 02/21/2016 1052   LEUKOCYTESUR NEGATIVE 02/21/2016 1052   Sepsis Labs  Invalid input(s): PROCALCITONIN,  WBC,  LACTICIDVEN Microbiology No results found for this or any previous visit (from the past 240 hour(s)).  Time coordinating discharge: Over 30 minutes  Hazeline Junker, MD  [facility] 02/23/2016, 10:14 AM Pager [phone]  If 7PM-7AM, please contact night-coverage [email or web address] Password [email or web address]

## 2016-02-24 ENCOUNTER — Non-Acute Institutional Stay (SKILLED_NURSING_FACILITY): Payer: Medicare Other | Admitting: Adult Health

## 2016-02-24 ENCOUNTER — Encounter: Payer: Self-pay | Admitting: Adult Health

## 2016-02-24 DIAGNOSIS — G3183 Dementia with Lewy bodies: Secondary | ICD-10-CM

## 2016-02-24 DIAGNOSIS — E43 Unspecified severe protein-calorie malnutrition: Secondary | ICD-10-CM

## 2016-02-24 DIAGNOSIS — R531 Weakness: Secondary | ICD-10-CM | POA: Diagnosis not present

## 2016-02-24 DIAGNOSIS — S42032A Displaced fracture of lateral end of left clavicle, initial encounter for closed fracture: Secondary | ICD-10-CM

## 2016-02-24 DIAGNOSIS — F0280 Dementia in other diseases classified elsewhere without behavioral disturbance: Secondary | ICD-10-CM

## 2016-02-24 DIAGNOSIS — K5901 Slow transit constipation: Secondary | ICD-10-CM

## 2016-02-24 DIAGNOSIS — E785 Hyperlipidemia, unspecified: Secondary | ICD-10-CM

## 2016-02-24 DIAGNOSIS — R627 Adult failure to thrive: Secondary | ICD-10-CM | POA: Diagnosis not present

## 2016-02-24 NOTE — Progress Notes (Signed)
DATE:  02/24/2016   MRN:  [id number]  BIRTHDAY: [birth year]  Facility:  Nursing Home Location:  [name]  Nursing Home Room Number: 1104-A  LEVEL OF CARE:  SNF [phone])  Contact Information    Name Relation Home Work Mobile   Brenda Spouse [phone]  [phone]   Daryel Gerald Daughter [phone]     Nate, Perri [phone]         Code Status History    Date Active Date Inactive Code Status Order ID Comments User Context   02/21/2016  5:44 PM 02/23/2016  6:09 PM DNR [id number]  Eddie North, MD Inpatient    Questions for Most Recent Historical Code Status (Order [id number])    Question Answer Comment   In the event of cardiac or respiratory ARREST Do not call a "code blue"    In the event of cardiac or respiratory ARREST Do not perform Intubation, CPR, defibrillation or ACLS    In the event of cardiac or respiratory ARREST Use medication by any route, position, wound care, and other measures to relive pain and suffering. May use oxygen, suction and manual treatment of airway obstruction as needed for comfort.        Chief Complaint  Patient presents with  . Hospitalization Follow-up    HISTORY OF PRESENT ILLNESS:  This is a 75-YO female seen for hospitalization followup.  She was admitted to [facility] on 02/23/2016 following an admission at [facility] 02/21/2016-03/04/2016 for progressive weakness. She fell from the chair while eating and landed on her left shoulder. She sustained a displaced distal left clavicle and bruising on right shoulder. She has PMH of advanced dementia and hyperlipidemia.   She was seen in the room with husband, son and daughter. She was nonverbal and does not follow command. Family wants patient to be full code at this time.   PAST MEDICAL HISTORY:  Past Medical History:  Diagnosis Date  . Dementia   . Hyperlipidemia   . kidney stones   . Memory loss      CURRENT MEDICATIONS: Reviewed    Patient's Medications  New Prescriptions   No medications on file  Previous Medications   ACETAMINOPHEN (TYLENOL) 500 MG TABLET    Take 500-1,000 mg by mouth every 6 (six) hours as needed for mild pain, moderate pain, fever or headache.   ASPIRIN EC 81 MG TABLET    Take 81 mg by mouth daily.   DONEPEZIL (ARICEPT) 10 MG TABLET    TAKE ONE TABLET AT BEDTIME.   EZETIMIBE (ZETIA) 10 MG TABLET    Take 1 tablet (10 mg total) by mouth daily. PLEASE CONTACT OFFICE FOR ADDITIONAL REFILLS 2ND ATTEMPT   HYDROCODONE-ACETAMINOPHEN (NORCO) 5-325 MG TABLET    Take 1-2 tablets by mouth every 6 (six) hours as needed for severe pain.   MEMANTINE (NAMENDA XR) 28 MG CP24 24 HR CAPSULE    Take 1 capsule (28 mg total) by mouth daily.   ONDANSETRON (ZOFRAN ODT) 4 MG DISINTEGRATING TABLET    Take 1 tablet (4 mg total) by mouth every 8 (eight) hours as needed for nausea or vomiting.   SENNA-DOCUSATE (SENOKOT-S) 8.6-50 MG TABLET    Take 1 tablet by mouth at bedtime as needed for mild constipation.  Modified Medications   No medications on file  Discontinued Medications   ACETAMINOPHEN (TYLENOL) 325 MG TABLET    Take 2 tablets (650 mg total) by mouth every 6 (six) hours as needed for mild  pain (or Fever >/= 101).     Allergies  Allergen Reactions  . Ciprofloxacin Other (See Comments)    Can't remember.  . Codeine     Can't remember   . Levofloxacin     Can't remember  . Niaspan [Niacin] Other (See Comments)    intolerant  . Sulfa Antibiotics   . Vytorin [Ezetimibe-Simvastatin] Other (See Comments)    myalgias     REVIEW OF SYSTEMS:  Unable to obtain due to advanced dementia    PHYSICAL EXAMINATION  GENERAL APPEARANCE: Well nourished. In no acute distress. Normal body habitus SKIN:  Skin is warm and dry.  HEAD: Normal in size and contour. No evidence of trauma EYES: Lids open and close normally. No blepharitis, entropion or ectropion. PERRL. Conjunctivae are clear and sclerae are white. Lenses are  without opacity EARS: Pinnae are normal. Patient hears normal voice tunes of the examiner MOUTH and THROAT: Lips are without lesions. Oral mucosa is moist and without lesions. Tongue is normal in shape, size, and color and without lesions NECK: supple, trachea midline, no neck masses, no thyroid tenderness, no thyromegaly LYMPHATICS: no LAN in the neck, no supraclavicular LAN RESPIRATORY: breathing is even & unlabored, BS CTAB CARDIAC: RRR, no murmur,no extra heart sounds, no edema GI: abdomen soft, normal BS, no masses, no tenderness, no hepatomegaly, no splenomegaly EXTREMITIES:  Able to move X 4 extremities. PSYCHIATRIC: Disoriented X 3. Affect and behavior are appropriate  LABS/RADIOLOGY: Labs reviewed: Basic Metabolic Panel:  Recent Labs  16/11/9609/13/17 1035 02/21/16 1145 02/21/16 1853 02/22/16 0434  NA 141 141  --  140  K 3.5 3.4*  --  3.9  CL 105 103  --  110  CO2 24 29  --  23  GLUCOSE 169* 109*  --  100*  BUN 21* 20  --  13  CREATININE 1.08* 0.73 0.65 0.52  CALCIUM 9.0 8.5*  --  7.8*   Liver Function Tests:  Recent Labs  09/01/15 1209 12/27/15 1035  AST 22 23  ALT 12* 11*  ALKPHOS 78 69  BILITOT 0.5 0.8  PROT 7.5 6.2*  ALBUMIN 4.3 3.6    Recent Labs  09/01/15 1209  LIPASE 31   CBC:  Recent Labs  09/01/15 1209 12/27/15 1035 02/21/16 1145 02/21/16 1853  WBC 12.4* 7.8 8.0 6.0  NEUTROABS 10.9* 3.8 5.5  --   HGB 15.0 14.4 13.5 14.5  HCT 44.3 42.7 40.8 42.3  MCV 95.5 95.5 96.0 95.9  PLT 213 235 233 193    CBG:  Recent Labs  12/27/15 1056  GLUCAP 159*      Dg Chest 2 View  Result Date: 02/21/2016 CLINICAL DATA:  Fall 2 days ago.  Bruising of the left shoulder. EXAM: CHEST  2 VIEW COMPARISON:  Left shoulder radiographs from the same day. FINDINGS: The heart size is normal. The lungs are clear. There is no edema or effusion. A displaced distal left clavicle fracture is again noted. IMPRESSION: 1. Displaced distal left clavicle fracture. 2. No  acute cardiopulmonary disease. Electronically Signed   By: Marin Robertshristopher  Mattern M.D.   On: 02/21/2016 11:48   Dg Shoulder Left  Result Date: 02/21/2016 CLINICAL DATA:  Weakness, fall 2 days ago, left shoulder bruising EXAM: LEFT SHOULDER - 2+ VIEW COMPARISON:  None. FINDINGS: Two views of the left shoulder submitted. There is displaced fracture of distal left clavicle. No shoulder dislocation. IMPRESSION: Displaced fracture of distal left clavicle. No shoulder dislocation. Electronically Signed   By: Lang SnowLiviu  Pop M.D.   On: 02/21/2016 11:38    ASSESSMENT/PLAN:  Generalized weakness - for Home health, PT and OT, for therapeutic strengthening exercises; fall precaution  Adult failure to thrive - she has been having poor appetite and advanced dementia; continue supportive care and monitor PO intake; family is open to the idea of feeding tubes when needed; she has ate a good amount of breakfast and lunch today  Protein calorie malnutrition, severe - encourage PO intake, monitor intake, RD consult  Displaced distal left clavicle fracture - secondary to mechanical fall; continue sling, Norco 5/325 mg 1-2 tabs by mouth every 6 hours and decrease Tylenol extra strength 500 mg 2 tabs= 1000 mg by mouth every 6 hours to Q 8 hours when necessary for pain  Dementia - advanced ; continue Aricept 10 mg 1 tab by mouth daily at bedtime and Namenda XR 28 mg by mouth daily; palliative consult with [facility]  Constipation - continue senna S 1 tab by mouth daily at bedtime when necessary  Hyperlipidemia - continue Ezetimibe 10 mg 1 tab by mouth daily    Goals of care:  Short-term rehabilitation    Monina C. Medina-Vargas - NP [employer] [phone]

## 2016-02-25 ENCOUNTER — Non-Acute Institutional Stay (SKILLED_NURSING_FACILITY): Payer: Medicare Other | Admitting: Internal Medicine

## 2016-02-25 ENCOUNTER — Encounter: Payer: Self-pay | Admitting: Internal Medicine

## 2016-02-25 DIAGNOSIS — F03C0 Unspecified dementia, severe, without behavioral disturbance, psychotic disturbance, mood disturbance, and anxiety: Secondary | ICD-10-CM

## 2016-02-25 DIAGNOSIS — E43 Unspecified severe protein-calorie malnutrition: Secondary | ICD-10-CM

## 2016-02-25 DIAGNOSIS — F0390 Unspecified dementia without behavioral disturbance: Secondary | ICD-10-CM

## 2016-02-25 DIAGNOSIS — K5900 Constipation, unspecified: Secondary | ICD-10-CM | POA: Diagnosis not present

## 2016-02-25 DIAGNOSIS — R531 Weakness: Secondary | ICD-10-CM

## 2016-02-25 DIAGNOSIS — S42032S Displaced fracture of lateral end of left clavicle, sequela: Secondary | ICD-10-CM

## 2016-02-25 DIAGNOSIS — I10 Essential (primary) hypertension: Secondary | ICD-10-CM | POA: Diagnosis not present

## 2016-02-25 NOTE — Progress Notes (Signed)
LOCATION: [name]  PCP: Gaye Alken, MD   Code Status: Full Code  Goals of care: Advanced Directive information Advanced Directives 02/21/2016  Does Patient Have a Medical Advance Directive? No  Does patient want to make changes to medical advance directive? No - Patient declined  Would patient like information on creating a medical advance directive? No - Patient declined       Extended Emergency Contact Information Primary Emergency Contact: Kaeding,Pruette Address: [address]          [place], [place] [address] [place] Home Phone: [phone] Mobile Phone: [phone] Relation: Spouse Secondary Emergency Contact: Blenda Pe[place] Home Phone: [phone] Relation: Daughter   Allergies  Allergen Reactions  . Ciprofloxacin Other (See Comments)    Can't remember.  . Codeine     Can't remember   . Levofloxacin     Can't remember  . Niaspan [Niacin] Other (See Comments)    intolerant  . Sulfa Antibiotics   . Vytorin [Ezetimibe-Simvastatin] Other (See Comments)    myalgias    Chief Complaint  Patient presents with  . New Admit To SNF    New Admission Visit      HPI:  Patient is a 76 y.o. female seen today for short term rehabilitation post hospital admission from 02/21/2016-02/23/2016 post fall with displaced left clavicle fracture. She was seen by orthopedic and a sling and pain medication was recommended. She was noted to have adult failure to thrive with severe protein calorie malnutrition.She has advanced dementia, hyperlipidemia, hypertension and was under total care at home. She is seen in her room today with her husband and daughter at bedside.  Review of Systems: Patient does not participate in history of present illness and review of system. Per husband, she was taking a stool softener on a daily basis at home. She needs complete assistance with feeding, toileting and transfer.   Past  Medical History:  Diagnosis Date  . Dementia   . Hyperlipidemia   . kidney stones   . Memory loss    Past Surgical History:  Procedure Laterality Date  . CARDIAC CATHETERIZATION  10/2008   SHOWED LEFT DOMINANT SYSTEM WITH NO SIGNIFICANT DISEASE  . DOPPLER ECHOCARDIOGRAPHY  Apr 07, 2010   NORMAL ECHO ,EF >55%,MILD IMPAIRED RELAXATION,MILD MR AND TR  . NM MYOCAR SINGLE W/SPECT  Apr 07 2010   EF 75% WITH NO ISCHEMIA OR INFARCTION   Social History:   reports that she has never smoked. She has never used smokeless tobacco. She reports that she drinks about 2.4 oz of alcohol per week . She reports that she does not use drugs.  Family History  Problem Relation Age of Onset  . Heart attack Father     Medications: Allergies as of 02/25/2016      Reactions   Ciprofloxacin Other (See Comments)   Can't remember.   Codeine    Can't remember   Levofloxacin    Can't remember   Niaspan [niacin] Other (See Comments)   intolerant   Sulfa Antibiotics    Vytorin [ezetimibe-simvastatin] Other (See Comments)   myalgias      Medication List       Accurate as of 02/25/16 11:35 AM. Always use your most recent med list.          acetaminophen 500 MG tablet Commonly known as:  TYLENOL Take 1,000 mg by mouth every 8 (eight) hours as needed for mild pain, moderate pain, fever  or headache.   aspirin EC 81 MG tablet Take 81 mg by mouth daily.   donepezil 10 MG tablet Commonly known as:  ARICEPT TAKE ONE TABLET AT BEDTIME.   ezetimibe 10 MG tablet Commonly known as:  ZETIA Take 1 tablet (10 mg total) by mouth daily. PLEASE CONTACT OFFICE FOR ADDITIONAL REFILLS 2ND ATTEMPT   HYDROcodone-acetaminophen 5-325 MG tablet Commonly known as:  NORCO Take 1-2 tablets by mouth every 6 (six) hours as needed for severe pain.   memantine 28 MG Cp24 24 hr capsule Commonly known as:  NAMENDA XR Take 1 capsule (28 mg total) by mouth daily.   ondansetron 4 MG disintegrating tablet Commonly known  as:  ZOFRAN ODT Take 1 tablet (4 mg total) by mouth every 8 (eight) hours as needed for nausea or vomiting.   senna-docusate 8.6-50 MG tablet Commonly known as:  Senokot-S Take 1 tablet by mouth at bedtime as needed for mild constipation.       Immunizations:  There is no immunization history on file for this patient.   Physical Exam: Vitals:   02/25/16 1134  BP: 136/85  Pulse: 87  Resp: 18  Temp: 98.1 F (36.7 C)  TempSrc: Oral  SpO2: 93%  Weight: 117 lb (53.1 kg)  Height: 5\' 4"  (1.626 m)   Body mass index is 20.08 kg/m.  General- elderly female, Thin built, frail, in no acute distress Head- normocephalic, atraumatic Nose- no nasal discharge Throat- moist mucus membrane Eyes- no pallor, no icterus, no discharge Neck- no cervical lymphadenopathy Cardiovascular- normal s1,s2, no murmur Respiratory- bilateral clear to auscultation, no wheeze, no rhonchi, no crackles, no use of accessory muscles Abdomen- bowel sounds present, soft, non tender Musculoskeletal- generalized weakness, limited range of motion with the left shoulder Neurological- does not participate in history of present illness and review of systems, pleasantly confused  Skin- warm and dry    Labs reviewed: Basic Metabolic Panel:  Recent Labs  53/66/44 1035 02/21/16 1145 02/21/16 1853 02/22/16 0434  NA 141 141  --  140  K 3.5 3.4*  --  3.9  CL 105 103  --  110  CO2 24 29  --  23  GLUCOSE 169* 109*  --  100*  BUN 21* 20  --  13  CREATININE 1.08* 0.73 0.65 0.52  CALCIUM 9.0 8.5*  --  7.8*   Liver Function Tests:  Recent Labs  09/01/15 1209 12/27/15 1035  AST 22 23  ALT 12* 11*  ALKPHOS 78 69  BILITOT 0.5 0.8  PROT 7.5 6.2*  ALBUMIN 4.3 3.6    Recent Labs  09/01/15 1209  LIPASE 31   No results for input(s): AMMONIA in the last 8760 hours. CBC:  Recent Labs  09/01/15 1209 12/27/15 1035 02/21/16 1145 02/21/16 1853  WBC 12.4* 7.8 8.0 6.0  NEUTROABS 10.9* 3.8 5.5  --     HGB 15.0 14.4 13.5 14.5  HCT 44.3 42.7 40.8 42.3  MCV 95.5 95.5 96.0 95.9  PLT 213 235 233 193   Cardiac Enzymes: No results for input(s): CKTOTAL, CKMB, CKMBINDEX, TROPONINI in the last 8760 hours. BNP: Invalid input(s): POCBNP CBG:  Recent Labs  12/27/15 1056  GLUCAP 159*    Radiological Exams: Dg Chest 2 View  Result Date: 02/21/2016 CLINICAL DATA:  Fall 2 days ago.  Bruising of the left shoulder. EXAM: CHEST  2 VIEW COMPARISON:  Left shoulder radiographs from the same day. FINDINGS: The heart size is normal. The lungs are clear. There  is no edema or effusion. A displaced distal left clavicle fracture is again noted. IMPRESSION: 1. Displaced distal left clavicle fracture. 2. No acute cardiopulmonary disease. Electronically Signed   By: Marin Robertshristopher  Mattern M.D.   On: 02/21/2016 11:48   Dg Shoulder Left  Result Date: 02/21/2016 CLINICAL DATA:  Weakness, fall 2 days ago, left shoulder bruising EXAM: LEFT SHOULDER - 2+ VIEW COMPARISON:  None. FINDINGS: Two views of the left shoulder submitted. There is displaced fracture of distal left clavicle. No shoulder dislocation. IMPRESSION: Displaced fracture of distal left clavicle. No shoulder dislocation. Electronically Signed   By: Natasha MeadLiviu  Pop M.D.   On: 02/21/2016 11:38    Assessment/Plan   Generalized weakness From physical deconditioning with advanced dementia failure to thrive. Has overall poor prognosis. Will have her to work with physical therapy and occupational therapy to help regain her strength and balance. If no improvement, to consider palliative care consult.  Left clavicle displaced fracture Seen by orthopedic and conservator management with a sling and pain medication recommended. Patient does not appear in any distress at present. Continue Tylenol on a needed basis and hydrocodone-acetaminophen 5-3 25 mg 1-2 tablet every 6 hours as needed for pain for pain and monitor. To provide ice pack twice a day to the affected area  and monitor. Also schedule tylenol 500 mg bid x 1 week.  Severe protein calorie malnutrition Will get registered dietitian to evaluate the patient. Encourage oral intake. To provide assistance with each meal.   Advanced dementia Continue Aricept 10 mg daily and Namenda XL. Provide supportive care. Get speech consult. Aspiration precautions to be taken.  Constipation Continue senna but change it to daily at bedtime from when necessary order  Hypertension Currently off all antihypertensive. Blood pressure has been good. Monitor    Goals of care: short term rehabilitation, possible long term care   Labs/tests ordered: cbc, bmp1/17/18  Family/ staff Communication: reviewed care plan with patient, her husband and Dr. and nursing supervisor    Oneal GroutMAHIMA Jahmya Onofrio, MD Internal Medicine [facility] [address] [place], [place] [address] Cell Phone (Monday-Friday 8 am - 5 pm): [phone] On Call: [phone] and follow prompts after 5 pm and on weekends Office Phone: [phone] Office Fax: [phone]

## 2016-03-01 LAB — BASIC METABOLIC PANEL
BUN: 27 mg/dL — AB (ref 4–21)
CREATININE: 0.8 mg/dL (ref 0.5–1.1)
Glucose: 114 mg/dL
POTASSIUM: 4.2 mmol/L (ref 3.4–5.3)
SODIUM: 143 mmol/L (ref 137–147)

## 2016-03-01 LAB — CBC AND DIFFERENTIAL
HCT: 41 % (ref 36–46)
Hemoglobin: 13.7 g/dL (ref 12.0–16.0)
Neutrophils Absolute: 6 /uL
PLATELETS: 233 10*3/uL (ref 150–399)
WBC: 8.2 10^3/mL

## 2016-03-08 ENCOUNTER — Encounter: Payer: Self-pay | Admitting: Adult Health

## 2016-03-08 ENCOUNTER — Non-Acute Institutional Stay (SKILLED_NURSING_FACILITY): Payer: Medicare Other | Admitting: Adult Health

## 2016-03-08 DIAGNOSIS — Z7189 Other specified counseling: Secondary | ICD-10-CM | POA: Diagnosis not present

## 2016-03-08 DIAGNOSIS — E870 Hyperosmolality and hypernatremia: Secondary | ICD-10-CM | POA: Diagnosis not present

## 2016-03-08 DIAGNOSIS — F03C0 Unspecified dementia, severe, without behavioral disturbance, psychotic disturbance, mood disturbance, and anxiety: Secondary | ICD-10-CM

## 2016-03-08 DIAGNOSIS — F0390 Unspecified dementia without behavioral disturbance: Secondary | ICD-10-CM

## 2016-03-08 NOTE — Progress Notes (Signed)
DATE:  03/08/2016   MRN:  [id number]  BIRTHDAY: 05/28/1940  Facility:  Nursing Home Location:  [name]  Nursing Home Room Number: 1104-A  LEVEL OF CARE:  SNF [phone](31)  Contact Information    Name Relation Home Work Mobile   New Marketarter,Pruette Spouse [phone]  [phone]   Daryel GeraldBrandus,Lynn Daughter [phone]     Morton PetersCarter,Bryan Son [phone]         Code Status History    Date Active Date Inactive Code Status Order ID Comments User Context   02/21/2016  5:44 PM 02/23/2016  6:09 PM DNR [id number]  Eddie NorthNishant Dhungel, MD Inpatient    Questions for Most Recent Historical Code Status (Order [id number])    Question Answer Comment   In the event of cardiac or respiratory ARREST Do not call a "code blue"    In the event of cardiac or respiratory ARREST Do not perform Intubation, CPR, defibrillation or ACLS    In the event of cardiac or respiratory ARREST Use medication by any route, position, wound care, and other measures to relive pain and suffering. May use oxygen, suction and manual treatment of airway obstruction as needed for comfort.        Chief Complaint  Patient presents with  . Acute Visit    Change in condition    HISTORY OF PRESENT ILLNESS:  This is a 75-YO female seen for an acute visit for a change of condition. Patient noted to be non-responsive. Latest lab showed hgb 15.8  Wbc 20.5 , platelet 120  Na 163, creatinine 4.57 and GFR 9.88. Husband is at bedside and verbalized that he wants patient to be comfortable, not transferred to hospital and to give morphine. Patient is allergic to codeine but would like to give morphine still. He does not know what patient's reaction when given codeine.  [facility]/21/2016-03/04/2016 for progressive weakness. She fell from the chair while eating and landed on her left shoulder. She sustained a displaced distal left clavicle and  bruising on right shoulder. She has PMH of advanced dementia and hyperlipidemia.    PAST MEDICAL HISTORY:  Past Medical History:  Diagnosis Date  . Dementia   . Hyperlipidemia   . kidney stones   . Memory loss      CURRENT MEDICATIONS: Reviewed  Patient's Medications  New Prescriptions   No medications on file  Previous Medications   ACETAMINOPHEN (TYLENOL) 650 MG SUPPOSITORY    Place 650 mg rectally every 4 (four) hours as needed for fever.  Modified Medications   No medications on file  Discontinued Medications   ACETAMINOPHEN (TYLENOL) 500 MG TABLET    Take 1,000 mg by mouth every 8 (eight) hours as needed for mild pain, moderate pain, fever or headache.    ASPIRIN EC 81 MG TABLET    Take 81 mg by mouth daily.   DONEPEZIL (ARICEPT) 10 MG TABLET    TAKE ONE TABLET AT BEDTIME.   EZETIMIBE (ZETIA) 10 MG TABLET    Take 1 tablet (10 mg total) by mouth daily. PLEASE CONTACT OFFICE FOR ADDITIONAL REFILLS 2ND ATTEMPT   HYDROCODONE-ACETAMINOPHEN (NORCO) 5-325 MG TABLET    Take 1-2 tablets by mouth every 6 (six) hours as needed for severe pain.   MEMANTINE (NAMENDA XR) 28 MG CP24 24 HR CAPSULE    Take 1 capsule (28 mg total) by mouth daily.   ONDANSETRON (ZOFRAN ODT) 4 MG  DISINTEGRATING TABLET    Take 1 tablet (4 mg total) by mouth every 8 (eight) hours as needed for nausea or vomiting.   SENNA-DOCUSATE (SENOKOT-S) 8.6-50 MG TABLET    Take 1 tablet by mouth at bedtime as needed for mild constipation.     Allergies  Allergen Reactions  . Ciprofloxacin Other (See Comments)    Can't remember.  . Codeine     Can't remember   . Levofloxacin     Can't remember  . Niaspan [Niacin] Other (See Comments)    intolerant  . Sulfa Antibiotics   . Vytorin [Ezetimibe-Simvastatin] Other (See Comments)    myalgias     REVIEW OF SYSTEMS:  Unable to obtain due to unresponsive status    PHYSICAL EXAMINATION  GENERAL APPEARANCE:  Thin female SKIN:  Skin is sweaty.  HEAD: Normal in size  and contour. No evidence of trauma EYES: Eyes closed MOUTH and THROAT: Lips are without lesions.  NECK: supple, trachea midline, no neck masses, no thyroid tenderness, no thyromegaly LYMPHATICS: no LAN in the neck, no supraclavicular LAN RESPIRATORY: breathing is even , has O2 @ 2L/min CARDIAC:  Tachycardic, no extra heart sounds, no edema GI: abdomen soft, normal BS, no masses NEURO:  Unresponsive    LABS/RADIOLOGY: Labs reviewed: 03/07/16   03/07/16  Wbc 20.5  hgb 15.8  Platelet 120  Na 163  Creatinine 4.57  GFR 9.88 Basic Metabolic Panel:  Recent Labs  60/45/40 1035 02/21/16 1145 02/21/16 1853 02/22/16 0434 03/01/16  NA 141 141  --  140 143  K 3.5 3.4*  --  3.9 4.2  CL 105 103  --  110  --   CO2 24 29  --  23  --   GLUCOSE 169* 109*  --  100*  --   BUN 21* 20  --  13 27*  CREATININE 1.08* 0.73 0.65 0.52 0.8  CALCIUM 9.0 8.5*  --  7.8*  --    Liver Function Tests:  Recent Labs  09/01/15 1209 12/27/15 1035  AST 22 23  ALT 12* 11*  ALKPHOS 78 69  BILITOT 0.5 0.8  PROT 7.5 6.2*  ALBUMIN 4.3 3.6    Recent Labs  09/01/15 1209  LIPASE 31   CBC:  Recent Labs  12/27/15 1035 02/21/16 1145 02/21/16 1853 03/01/16  WBC 7.8 8.0 6.0 8.2  NEUTROABS 3.8 5.5  --  6  HGB 14.4 13.5 14.5 13.7  HCT 42.7 40.8 42.3 41  MCV 95.5 96.0 95.9  --   PLT 235 233 193 233    CBG:  Recent Labs  12/27/15 1056  GLUCAP 159*      Dg Chest 2 View  Result Date: 02/21/2016 CLINICAL DATA:  Fall 2 days ago.  Bruising of the left shoulder. EXAM: CHEST  2 VIEW COMPARISON:  Left shoulder radiographs from the same day. FINDINGS: The heart size is normal. The lungs are clear. There is no edema or effusion. A displaced distal left clavicle fracture is again noted. IMPRESSION: 1. Displaced distal left clavicle fracture. 2. No acute cardiopulmonary disease. Electronically Signed   By: Marin Roberts M.D.   On: 02/21/2016 11:48   Dg Shoulder Left  Result Date: 02/21/2016 CLINICAL  DATA:  Weakness, fall 2 days ago, left shoulder bruising EXAM: LEFT SHOULDER - 2+ VIEW COMPARISON:  None. FINDINGS: Two views of the left shoulder submitted. There is displaced fracture of distal left clavicle. No shoulder dislocation. IMPRESSION: Displaced fracture of distal left clavicle. No shoulder dislocation.  Electronically Signed   By: Natasha Mead M.D.   On: 02/21/2016 11:38    ASSESSMENT/PLAN:  Dementia - advanced; for comfort care; patient is allergic to codeine but husband would like to take the chance and start patient on Roxanol 20 mg give 5mg /0.25 ml SL Q 4 hours PRN  Hypernatremia - she has been having poop PO intake and has advanced dementia; husband does not want IV fluids nor patient transferred to the hospital  Advanced care planning/counseling discussion - husband would like patient to be DNR (do not resuscitate) and for her to be made comfortable; no IV fluids nor transferred to the hospital.      Kenzel Ruesch C. Medina-Vargas - NP [employer] [phone]

## 2016-03-16 DEATH — deceased

## 2017-10-05 IMAGING — CT CT ABD-PELV W/ CM
2 of 5 series · 15 of 46 positions shown, 17 images · IV contrast (ISOVUE)
Comparison: 08/08/2013.

CLINICAL DATA: Left lower quadrant abdominal pain since this
morning.

EXAM:
CT ABDOMEN AND PELVIS WITH CONTRAST
TECHNIQUE: Multidetector CT imaging of the abdomen and pelvis was performed
using the standard protocol following bolus administration of
intravenous contrast.
CONTRAST:  100mL DHPHWW-VDD IOPAMIDOL (DHPHWW-VDD) INJECTION 61%

[Series 2: abd/pel with · axial · 0.74mm/px · z∈[+1172,+1562]mm · 12 of 90 slices shown, 14 images]
[im 6/90  soft-tissue]
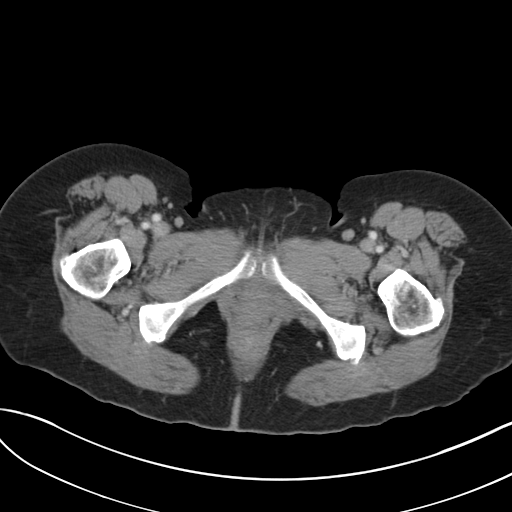
[im 6/90  bone]
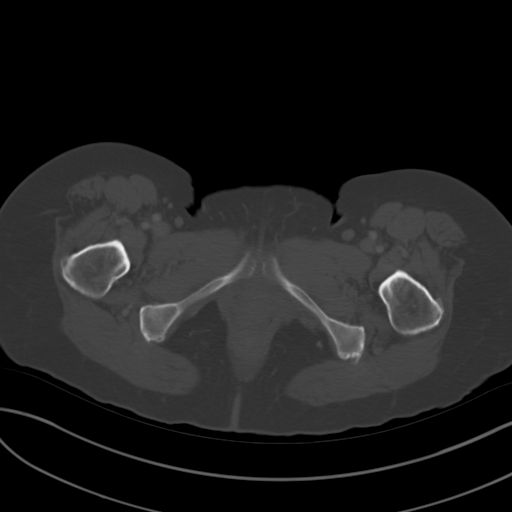
[im 16/90  soft-tissue]
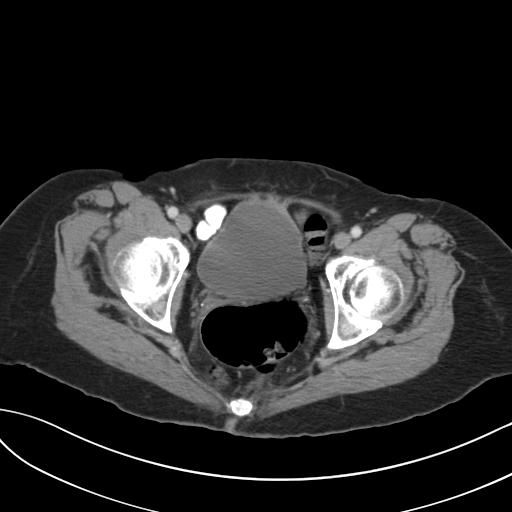
[im 21/90  soft-tissue]
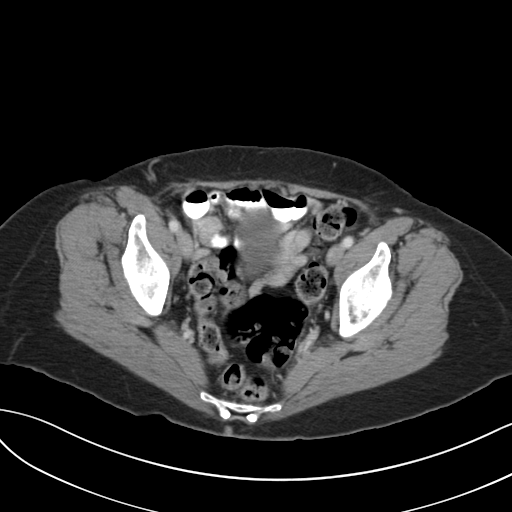
[im 27/90  soft-tissue]
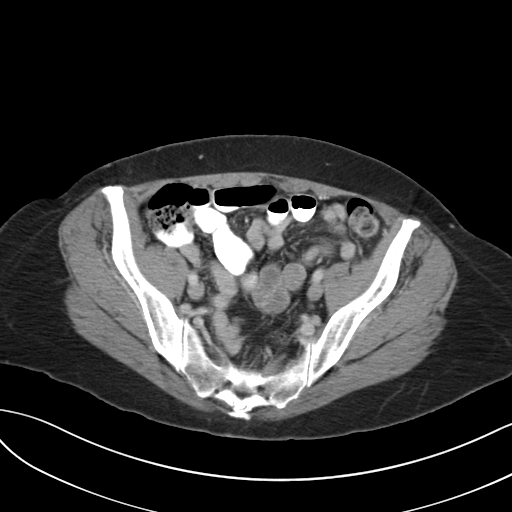
[im 37/90  soft-tissue]
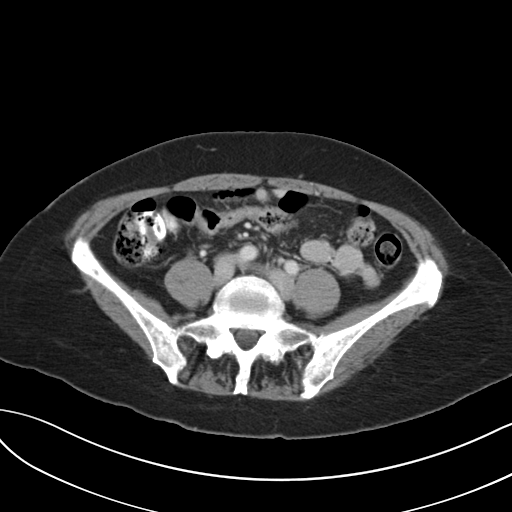
[im 42/90  soft-tissue]
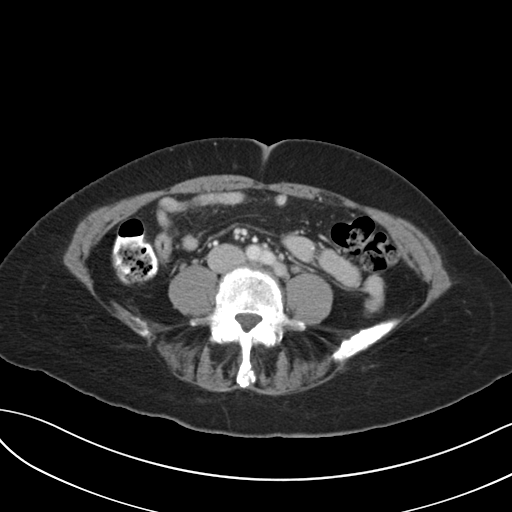
[im 48/90  soft-tissue]
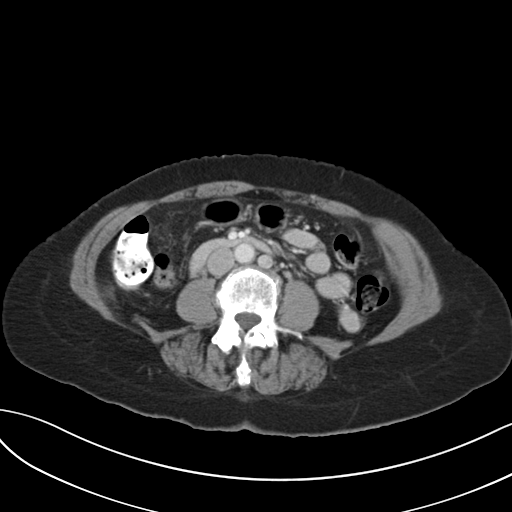
[im 58/90  soft-tissue]
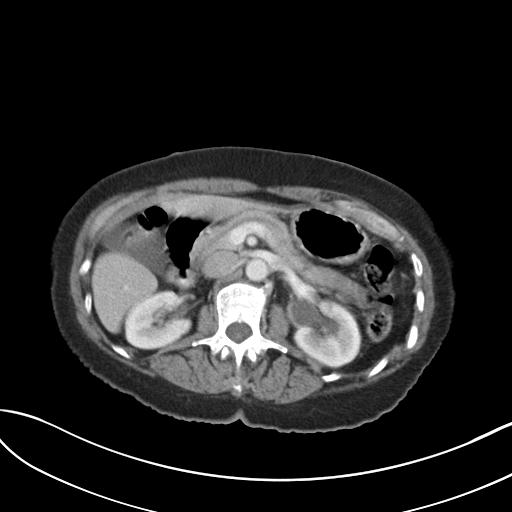
[im 63/90  soft-tissue]
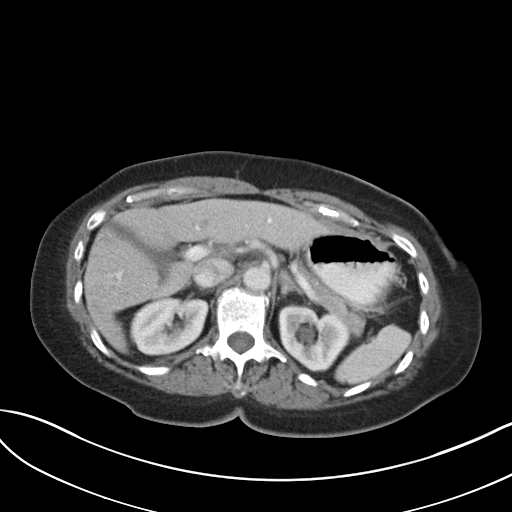
[im 63/90  bone]
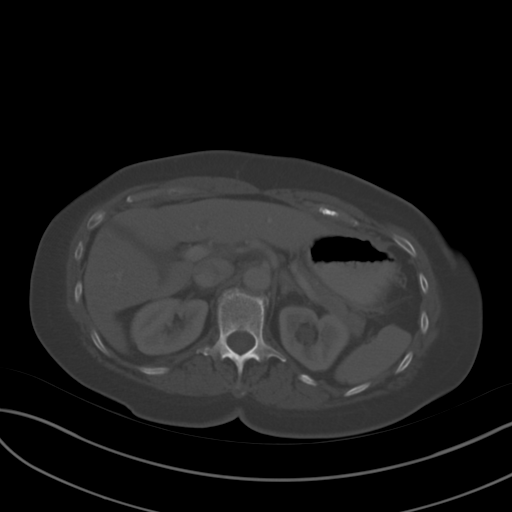
[im 69/90  soft-tissue]
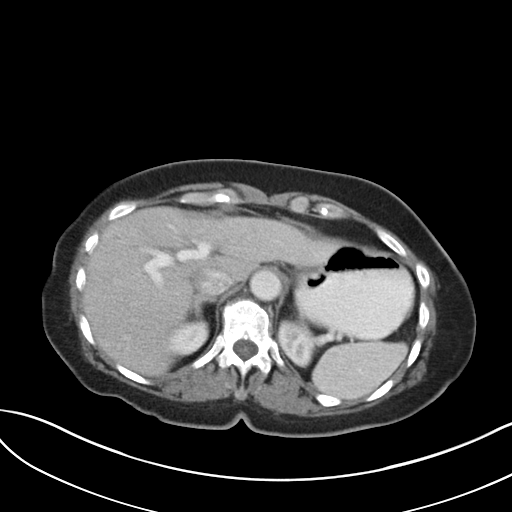
[im 79/90  soft-tissue]
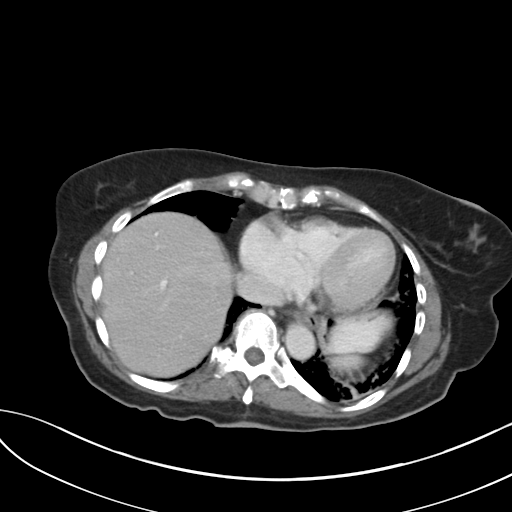
[im 84/90  soft-tissue]
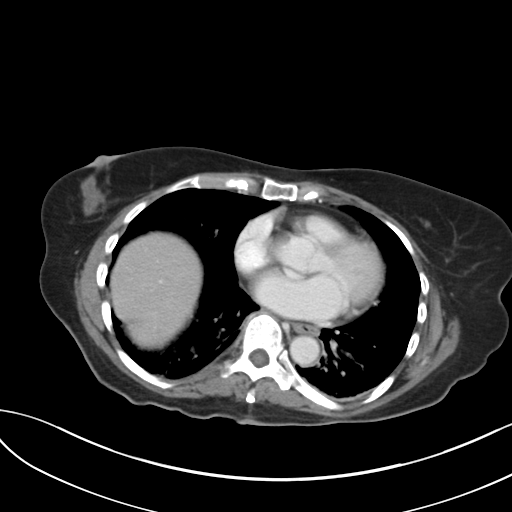

[Series 5: coronal a/|p · coronal · 0.66mm/px · 3 of 122 slices shown]
[im 41/122  soft-tissue]
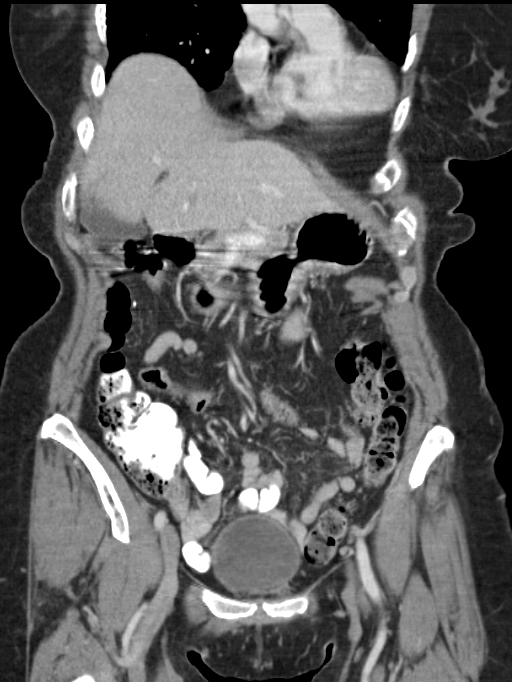
[im 54/122  soft-tissue]
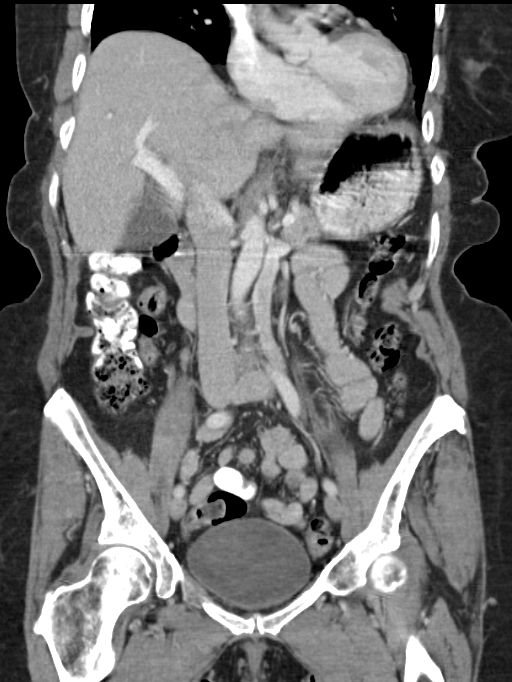
[im 68/122  soft-tissue]
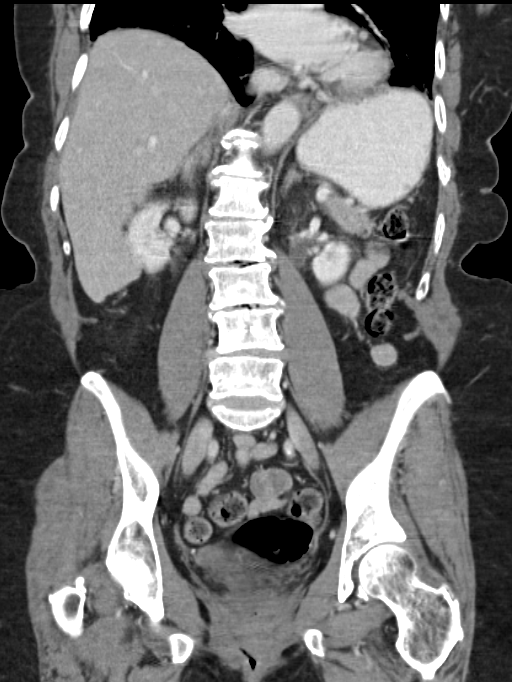

[15 of 46 positions shown; findings below may reference images not displayed]

FINDINGS: Lower chest:  Compressive atelectasis noted both lung bases.

Hepatobiliary: No focal abnormality within the liver parenchyma.
There is no evidence for gallstones, gallbladder wall thickening, or
pericholecystic fluid. No intrahepatic or extrahepatic biliary
dilation.

Pancreas: Slight diffuse prominence of the main pancreatic duct is
unchanged in the interval. No focal pancreatic mass lesion evident.

Spleen: No splenomegaly. No focal mass lesion.

Adrenals/Urinary Tract: No adrenal nodule or mass. Right kidney is
normal in appearance. There is mild fullness of the left intrarenal
collecting system. 5 x 6 x 7 mm stone is identified at the left UPJ.
No other left ureteral stone. No right ureteral stone. No bladder
stone.

Stomach/Bowel: Stomach is nondistended. No gastric wall thickening.
No evidence of outlet obstruction. Duodenum is normally positioned
as is the ligament of Treitz. No small bowel wall thickening. No
small bowel dilatation. The terminal ileum is normal. The appendix
is not visualized, but there is no edema or inflammation in the
region of the cecum. No gross colonic mass. No colonic wall
thickening. No substantial diverticular change.

Vascular/Lymphatic: No abdominal aortic aneurysm. Persistent
left-sided IVC noted. No abdominal aortic atherosclerotic
calcification. There is no gastrohepatic or hepatoduodenal ligament
lymphadenopathy. No intraperitoneal or retroperitoneal
lymphadenopathy.

Reproductive: Without uterus surgically absent. There is no adnexal
mass.

Other: No intraperitoneal free fluid.

Musculoskeletal: Bone windows reveal no worrisome lytic or sclerotic
osseous lesions.
IMPRESSION: 1. 5 x 6 x 7 mm left UPJ stone causes mild secondary changes left
kidney.
2. Otherwise no acute findings in the abdomen or pelvis.
